# Patient Record
Sex: Male | Born: 1985 | Race: White | Hispanic: No | Marital: Single | Smoking: Current every day smoker
Health system: Southern US, Community
[De-identification: ages and names within clinical notes are randomized; demographics above are authoritative.]

## PROBLEM LIST (undated history)

## (undated) DIAGNOSIS — B1920 Unspecified viral hepatitis C without hepatic coma: Secondary | ICD-10-CM

---

## 2006-11-17 ENCOUNTER — Emergency Department: Payer: Self-pay | Admitting: Emergency Medicine

## 2006-11-20 ENCOUNTER — Emergency Department: Payer: Self-pay | Admitting: Emergency Medicine

## 2007-11-20 ENCOUNTER — Emergency Department: Payer: Self-pay | Admitting: Emergency Medicine

## 2008-06-24 ENCOUNTER — Inpatient Hospital Stay: Payer: Self-pay | Admitting: Psychiatry

## 2009-01-14 ENCOUNTER — Emergency Department: Payer: Self-pay | Admitting: Emergency Medicine

## 2011-04-20 ENCOUNTER — Emergency Department: Payer: Self-pay | Admitting: Emergency Medicine

## 2011-11-20 ENCOUNTER — Emergency Department: Payer: Self-pay | Admitting: Emergency Medicine

## 2011-11-23 ENCOUNTER — Emergency Department: Payer: Self-pay | Admitting: Emergency Medicine

## 2011-11-25 ENCOUNTER — Emergency Department: Payer: Self-pay | Admitting: Emergency Medicine

## 2013-08-06 ENCOUNTER — Emergency Department: Payer: Self-pay | Admitting: Emergency Medicine

## 2013-10-09 ENCOUNTER — Emergency Department: Payer: Self-pay | Admitting: Emergency Medicine

## 2015-06-29 ENCOUNTER — Encounter: Payer: Self-pay | Admitting: Emergency Medicine

## 2015-06-29 ENCOUNTER — Telehealth: Payer: Self-pay | Admitting: Emergency Medicine

## 2015-06-29 ENCOUNTER — Emergency Department
Admission: EM | Admit: 2015-06-29 | Discharge: 2015-06-29 | Disposition: A | Discharge status: Home / Self Care | Payer: Self-pay | Attending: Emergency Medicine | Admitting: Emergency Medicine

## 2015-06-29 DIAGNOSIS — F17210 Nicotine dependence, cigarettes, uncomplicated: Secondary | ICD-10-CM | POA: Insufficient documentation

## 2015-06-29 DIAGNOSIS — F419 Anxiety disorder, unspecified: Secondary | ICD-10-CM | POA: Insufficient documentation

## 2015-06-29 DIAGNOSIS — F1990 Other psychoactive substance use, unspecified, uncomplicated: Secondary | ICD-10-CM

## 2015-06-29 DIAGNOSIS — F1910 Other psychoactive substance abuse, uncomplicated: Secondary | ICD-10-CM | POA: Insufficient documentation

## 2015-06-29 HISTORY — DX: Unspecified viral hepatitis C without hepatic coma: B19.20

## 2015-06-29 LAB — CBC
HEMATOCRIT: 43.6 % (ref 40.0–52.0)
Hemoglobin: 14.3 g/dL (ref 13.0–18.0)
MCH: 28.9 pg (ref 26.0–34.0)
MCHC: 32.8 g/dL (ref 32.0–36.0)
MCV: 88.3 fL (ref 80.0–100.0)
Platelets: 179 10*3/uL (ref 150–440)
RBC: 4.94 MIL/uL (ref 4.40–5.90)
RDW: 13.2 % (ref 11.5–14.5)
WBC: 8.6 10*3/uL (ref 3.8–10.6)

## 2015-06-29 LAB — ACETAMINOPHEN LEVEL: Acetaminophen (Tylenol), Serum: <10 ug/mL — ABNORMAL LOW (ref 10–30)

## 2015-06-29 LAB — COMPREHENSIVE METABOLIC PANEL
ALK PHOS: 83 U/L (ref 38–126)
ALT: 119 U/L — ABNORMAL HIGH (ref 17–63)
AST: 63 U/L — AB (ref 15–41)
Albumin: 4.4 g/dL (ref 3.5–5.0)
Anion gap: 6 (ref 5–15)
BUN: 7 mg/dL (ref 6–20)
CHLORIDE: 103 mmol/L (ref 101–111)
CO2: 30 mmol/L (ref 22–32)
Calcium: 9.3 mg/dL (ref 8.9–10.3)
Creatinine, Ser: 0.8 mg/dL (ref 0.61–1.24)
Glucose, Bld: 101 mg/dL — ABNORMAL HIGH (ref 65–99)
Potassium: 4.2 mmol/L (ref 3.5–5.1)
Sodium: 139 mmol/L (ref 135–145)
Total Bilirubin: 0.1 mg/dL — ABNORMAL LOW (ref 0.3–1.2)
Total Protein: 7.8 g/dL (ref 6.5–8.1)

## 2015-06-29 LAB — ETHANOL

## 2015-06-29 MED ORDER — NALOXONE HCL 0.4 MG/0.4ML IJ SOAJ: 1.0000 "application " | Freq: Once | INTRAMUSCULAR | Status: DC

## 2015-06-29 NOTE — ED Notes (Signed)
AAOx3.  Skin warm and dry.  Ambulates with easy and steady gait. NAD.  D/C home 

## 2015-06-29 NOTE — ED Notes (Signed)
Pt reports he took what he thought was LSD, but was given something else with research chemicals.  Pt states when he took the paper and placed it on his tongue after 10-15 mins his tongue was numb for about 30 minutes and it had a bitter taste. Pt states he was having chest pain and shortness of breath and headache.  He waited about 6 hours until he decided to come back when he wasn't getting any better.  Pt took it around 0330 this morning. Pt states the research chemicals contained 25-I.  Pt states now his chest pain is improving but is aching and c/o headache still. Pt ambulatory without difficulty.  Pt admits to using cocaine 2 days ago and smoked marijuana yesterday too.

## 2015-06-29 NOTE — ED Notes (Signed)
Patient moved to 1 Hallway due to critically ill patient needing room from EMS.  Reasons for moving patient explained and apologized for any inconvenience.  Patient verbalizes understanding about reasons for moving to hall bed.  No complaints.

## 2015-06-29 NOTE — ED Provider Notes (Signed)
[facility] Emergency Department Provider Note REMINDER - THIS NOTE IS NOT A FINAL MEDICAL RECORD UNTIL IT IS SIGNED. UNTIL THEN, THE CONTENT BELOW MAY REFLECT INFORMATION FROM A DOCUMENTATION TEMPLATE, NOT THE ACTUAL PATIENT VISIT. ____________________________________________  Time seen: Approximately 2:28 PM  I have reviewed the triage vital signs and the nursing notes.   HISTORY  Chief Complaint Drug Problem    HPI Adrian Benson is a 29 y.o. male presents after taking a synthetic 25 I drugaccidentally thinking and originally LSD. States he developed a bitter taste, and attempted and spit up a portion of the pill at 3 AM. He then developed symptoms of tremulousness, feeling very anxious, states he thinks he had a "panic attack" during the early morning. Denies hallucinations. He is very close was not a suicide attempt. 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  He had a moderate headache, this has resolved. He currently reports feeling much better but wanted to make sure that he was safe after having used this drug. He clearly expresses that he is not going to do this again.  He also reports he had a fluttering in his chest and chest discomfort/tightness that occurred earlier today and is now again resolved.   Past Medical History  Diagnosis Date  . Hepatitis C     There are no active problems to display for this patient.   History reviewed. No pertinent past surgical history.  Current Outpatient Rx  Name  Route  Sig  Dispense  Refill  . Naloxone HCl 0.4 MG/0.4ML SOAJ   Injection   Inject 1 application as directed once. Dispense 1 naloxone autoinjector kit   1 Package   0     Allergies Review of patient's allergies indicates no known allergies.  History reviewed. No pertinent family history.  Social History Social History  Substance Use Topics  . Smoking status: Current Every Day Smoker -- 1.00 packs/day    Types:  Cigarettes  . Smokeless tobacco: None  . Alcohol Use: No    Review of Systems Constitutional: No fever/chills Eyes: No visual changes. ENT: No sore throat. Cardiovascular: See history of present illness Respiratory: Denies shortness of breath. Gastrointestinal: No abdominal pain.  No nausea, no vomiting.  No diarrhea.  No constipation. Genitourinary: Negative for dysuria. Musculoskeletal: Negative for back pain. Skin: Negative for rash. Neurological: Negative for focal weakness or numbness. Headache improved. No slurred speech. No trouble walking. He did have a tremor earlier but is resolved.  10-point ROS otherwise negative.  ____________________________________________   PHYSICAL EXAM:  VITAL SIGNS: ED Triage Vitals  Enc Vitals Group     BP 06/29/15 1157 140/90 mmHg     Pulse Rate 06/29/15 1157 76     Resp 06/29/15 1157 20     Temp 06/29/15 1157 98.3 F (36.8 C)     Temp Source 06/29/15 1157 Oral     SpO2 06/29/15 1157 96 %     Weight 06/29/15 1157 170 lb (77.111 kg)     Height 06/29/15 1157 '6\' 2"'  (1.88 m)     Head Cir --      Peak Flow --      Pain Score --      Pain Loc --      Pain Edu? --      Excl. in [place]? --    Constitutional: Alert and oriented. Well appearing and in no acute distress. Eyes: Conjunctivae are normal. PERRL and midline. EOMI. Head:  Atraumatic. Nose: No congestion/rhinnorhea. Mouth/Throat: Mucous membranes are moist.  Oropharynx non-erythematous. Neck: No stridor.   Cardiovascular: Normal rate, regular rhythm. Grossly normal heart sounds.  Good peripheral circulation. Respiratory: Normal respiratory effort.  No retractions. Lungs CTAB. Gastrointestinal: Soft and nontender. No distention. No abdominal bruits. No CVA tenderness. Musculoskeletal: No lower extremity tenderness nor edema.  No joint effusions. Neurologic:  Normal speech and language. No gross focal neurologic deficits are appreciated. No gait instability. Normal reflexes patellar  bilateral. No clonus. Skin:  Skin is warm, dry and intact. No rash noted. Psychiatric: Mood and affect are normal. Speech and behavior are normal.  ____________________________________________   LABS (all labs ordered are listed, but only abnormal results are displayed)  Labs Reviewed  COMPREHENSIVE METABOLIC PANEL - Abnormal; Notable for the following:    Glucose, Bld 101 (*)    AST 63 (*)    ALT 119 (*)    Total Bilirubin 0.1 (*)    All other components within normal limits  ACETAMINOPHEN LEVEL - Abnormal; Notable for the following:    Acetaminophen (Tylenol), Serum <10 (*)    All other components within normal limits  ETHANOL  CBC  URINE DRUG SCREEN, QUALITATIVE ([facility] ONLY)   ____________________________________________  EKG  ED ECG REPORT I, QUALE, MARK, the attending physician, personally viewed and interpreted this ECG.  Date: 06/29/2015 EKG Time: 1155 Rate: 65 Rhythm: normal sinus rhythm QRS Axis: normal Intervals: normal ST/T Wave abnormalities: normal Conduction Disutrbances: none Narrative Interpretation: unremarkable  ____________________________________________  RADIOLOGY   ____________________________________________   PROCEDURES  Procedure(s) performed: None  Critical Care performed: No  ____________________________________________   INITIAL IMPRESSION / ASSESSMENT AND PLAN / ED COURSE  Pertinent labs & imaging results that were available during my care of the patient were reviewed by me and considered in my medical decision making (see chart for details).  Patient presents after symptomatology after taking a synthetic drug. It does appear that he likely had side effects, but these appear to have resolved. His EKG is normal without evidence of abnormal intervals, normal QRS and QTc. He is hemodynamically stable. He is in no distress. His concerning symptoms including chest pain and headache have improved now/resolved. No neurologic  deficits.  He does have a mild transaminitis but reports a history of hepatitis C and that he has been told previously that his liver tests are slightly abnormal. He reports he's been told he needs to follow up with a doctor previously, but has not.  Patient reports symptoms and use of a synthetic call 25 5. Called and discussed with poison control, Bernice, who advises that at this time based on symptoms improvement that the patient would be safe for discharge.  Patient clearly knowledge is that this was a poor decision, and he is offered substance abuse treatment, but is not currently interested. Clearly this was not a suicide attempt, the patient presented voluntarily and reports that he actually ingested the wrong substance and experience side effects. I will discharge him to home, I will and have given him a prescription for naloxone and discussed with him the possibility of death and severe side effects from using illegal drugs/abusing drugs and he acknowledges these.  ----------------------------------------- 2:34 PM on 06/29/2015 -----------------------------------------  Awake alert no distress. Plan to discharge home if acetaminophen level is negative. ____________________________________________   FINAL CLINICAL IMPRESSION(S) / ED DIAGNOSES  Final diagnoses:  Recreational drug use      Delman Kitten, MD 06/29/15 1541

## 2015-06-29 NOTE — ED Notes (Signed)
Pt presents to ER after taking a "research chemical " even though he meant to take LSD at  0330 and noticed symptoms around 4-5 am this morning of SOB , chest pain, and headache.Pt is alert and oriented at present.

## 2015-08-01 ENCOUNTER — Emergency Department
Admission: EM | Admit: 2015-08-01 | Discharge: 2015-08-01 | Disposition: A | Discharge status: Home / Self Care | Payer: Self-pay | Attending: Emergency Medicine | Admitting: Emergency Medicine

## 2015-08-01 ENCOUNTER — Emergency Department: Payer: Self-pay

## 2015-08-01 ENCOUNTER — Encounter: Payer: Self-pay | Admitting: Emergency Medicine

## 2015-08-01 DIAGNOSIS — M25462 Effusion, left knee: Secondary | ICD-10-CM | POA: Insufficient documentation

## 2015-08-01 DIAGNOSIS — Z79891 Long term (current) use of opiate analgesic: Secondary | ICD-10-CM | POA: Insufficient documentation

## 2015-08-01 DIAGNOSIS — F17210 Nicotine dependence, cigarettes, uncomplicated: Secondary | ICD-10-CM | POA: Insufficient documentation

## 2015-08-01 DIAGNOSIS — S8992XA Unspecified injury of left lower leg, initial encounter: Secondary | ICD-10-CM | POA: Insufficient documentation

## 2015-08-01 DIAGNOSIS — Y9301 Activity, walking, marching and hiking: Secondary | ICD-10-CM | POA: Insufficient documentation

## 2015-08-01 DIAGNOSIS — Y9289 Other specified places as the place of occurrence of the external cause: Secondary | ICD-10-CM | POA: Insufficient documentation

## 2015-08-01 DIAGNOSIS — M25562 Pain in left knee: Secondary | ICD-10-CM

## 2015-08-01 DIAGNOSIS — W010XXA Fall on same level from slipping, tripping and stumbling without subsequent striking against object, initial encounter: Secondary | ICD-10-CM | POA: Insufficient documentation

## 2015-08-01 DIAGNOSIS — Y998 Other external cause status: Secondary | ICD-10-CM | POA: Insufficient documentation

## 2015-08-01 MED ORDER — IBUPROFEN 600 MG PO TABS
ORAL_TABLET | ORAL | Status: AC
  Filled 2015-08-01: qty 1

## 2015-08-01 MED ORDER — IBUPROFEN 800 MG PO TABS
800.0000 mg | ORAL_TABLET | Freq: Once | ORAL | Status: AC
Start: 2015-08-01 — End: 2015-08-01
  Administered 2015-08-01: 800 mg via ORAL
  Filled 2015-08-01: qty 1

## 2015-08-01 MED ORDER — NAPROXEN 500 MG PO TABS: 500.0000 mg | ORAL_TABLET | Freq: Two times a day (BID) | ORAL | Status: DC

## 2015-08-01 NOTE — ED Notes (Signed)
Reviewed d/c instructions, use of ice/elevation, prescriptions, and follow-up care with pt . Pt verbalized understanding.

## 2015-08-01 NOTE — Discharge Instructions (Signed)
Cryotherapy °Cryotherapy means treatment with cold. Ice or gel packs can be used to reduce both pain and swelling. Ice is the most helpful within the first 24 to 48 hours after an injury or flare-up from overusing a muscle or joint. Sprains, strains, spasms, burning pain, shooting pain, and aches can all be eased with ice. Ice can also be used when recovering from surgery. Ice is effective, has very few side effects, and is safe for most people to use. °PRECAUTIONS  °Ice is not a safe treatment option for people with: °· Raynaud phenomenon. This is a condition affecting small blood vessels in the extremities. Exposure to cold may cause your problems to return. °· Cold hypersensitivity. There are many forms of cold hypersensitivity, including: °· Cold urticaria. Red, itchy hives appear on the skin when the tissues begin to warm after being iced. °· Cold erythema. This is a red, itchy rash caused by exposure to cold. °· Cold hemoglobinuria. Red blood cells break down when the tissues begin to warm after being iced. The hemoglobin that carry oxygen are passed into the urine because they cannot combine with blood proteins fast enough. °· Numbness or altered sensitivity in the area being iced. °If you have any of the following conditions, do not use ice until you have discussed cryotherapy with your caregiver: °· Heart conditions, such as arrhythmia, angina, or chronic heart disease. °· High blood pressure. °· Healing wounds or open skin in the area being iced. °· Current infections. °· Rheumatoid arthritis. °· Poor circulation. °· Diabetes. °Ice slows the blood flow in the region it is applied. This is beneficial when trying to stop inflamed tissues from spreading irritating chemicals to surrounding tissues. However, if you expose your skin to cold temperatures for too long or without the proper protection, you can damage your skin or nerves. Watch for signs of skin damage due to cold. °HOME CARE INSTRUCTIONS °Follow  these tips to use ice and cold packs safely. °· Place a dry or damp towel between the ice and skin. A damp towel will cool the skin more quickly, so you may need to shorten the time that the ice is used. °· For a more rapid response, add gentle compression to the ice. °· Ice for no more than 10 to 20 minutes at a time. The bonier the area you are icing, the less time it will take to get the benefits of ice. °· Check your skin after 5 minutes to make sure there are no signs of a poor response to cold or skin damage. °· Rest 20 minutes or more between uses. °· Once your skin is numb, you can end your treatment. You can test numbness by very lightly touching your skin. The touch should be so light that you do not see the skin dimple from the pressure of your fingertip. When using ice, most people will feel these normal sensations in this order: cold, burning, aching, and numbness. °· Do not use ice on someone who cannot communicate their responses to pain, such as small children or people with dementia. °HOW TO MAKE AN ICE PACK °Ice packs are the most common way to use ice therapy. Other methods include ice massage, ice baths, and cryosprays. Muscle creams that cause a cold, tingly feeling do not offer the same benefits that ice offers and should not be used as a substitute unless recommended by your caregiver. °To make an ice pack, do one of the following: °· Place crushed ice or a   bag of frozen vegetables in a sealable plastic bag. Squeeze out the excess air. Place this bag inside another plastic bag. Slide the bag into a pillowcase or place a damp towel between your skin and the bag.  Mix 3 parts water with 1 part rubbing alcohol. Freeze the mixture in a sealable plastic bag. When you remove the mixture from the freezer, it will be slushy. Squeeze out the excess air. Place this bag inside another plastic bag. Slide the bag into a pillowcase or place a damp towel between your skin and the bag. SEEK MEDICAL CARE  IF:  You develop Tullis spots on your skin. This may give the skin a blotchy (mottled) appearance.  Your skin turns blue or pale.  Your skin becomes waxy or hard.  Your swelling gets worse. MAKE SURE YOU:   Understand these instructions.  Will watch your condition.  Will get help right away if you are not doing well or get worse.   This information is not intended to replace advice given to you by your health care provider. Make sure you discuss any questions you have with your health care provider.   Document Released: 03/08/2011 Document Revised: 08/02/2014 Document Reviewed: 03/08/2011 Elsevier Interactive Patient Education 2016 [employer].  How to Use a Knee Brace A knee brace is a device that you wear to support your knee, especially if the knee is healing after an injury or surgery. There are several types of knee braces. Some are designed to prevent an injury (prophylactic brace). These are often worn during sports. Others support an injured knee (functional brace) or keep it still while it heals (rehabilitative brace). People with severe arthritis of the knee may benefit from a brace that takes some pressure off the knee (unloader brace). Most knee braces are made from a combination of cloth and metal or plastic.  You may need to wear a knee brace to:  Relieve knee pain.  Help your knee support your weight (improve stability).  Help you walk farther (improve mobility).  Prevent injury.  Support your knee while it heals from surgery or from an injury. RISKS AND COMPLICATIONS Generally, knee braces are very safe to wear. However, problems may occur, including:  Skin irritation that may lead to infection.  Making your condition worse if you wear the brace in the wrong way. HOW TO USE A KNEE BRACE Different braces will have different instructions for use. Your health care provider will tell you or show you:  How to put on your brace.  How to adjust the  brace.  When and how often to wear the brace.  How to remove the brace.  If you will need any assistive devices in addition to the brace, such as crutches or a cane. In general, your brace should:  Have the hinge of the brace line up with the bend of your knee.  Have straps, hooks, or tapes that fasten snugly around your leg.  Not feel too tight or too loose. HOW TO CARE FOR A KNEE BRACE  Check your brace often for signs of damage, such as loose connections or attachments. Your knee brace may get damaged or wear out during normal use.  Wash the fabric parts of your brace with soap and water.  Read the insert that comes with your brace for other specific care instructions. SEEK MEDICAL CARE IF:  Your knee brace is too loose or too tight and you cannot adjust it.  Your knee brace causes skin  redness, swelling, bruising, or irritation.  Your knee brace is not helping.  Your knee brace is making your knee pain worse.   This information is not intended to replace advice given to you by your health care provider. Make sure you discuss any questions you have with your health care provider.   Document Released: 10/02/2003 Document Revised: 04/02/2015 Document Reviewed: 11/04/2014 Elsevier Interactive Patient Education 2016 Elsevier Inc.  Joint Pain Joint pain, which is also called arthralgia, can be caused by many things. Joint pain often goes away when you follow your health care provider's instructions for relieving pain at home. However, joint pain can also be caused by conditions that require further treatment. Common causes of joint pain include:  Bruising in the area of the joint.  Overuse of the joint.  Wear and tear on the joints that occur with aging (osteoarthritis).  Various other forms of arthritis.  A buildup of a crystal form of uric acid in the joint (gout).  Infections of the joint (septic arthritis) or of the bone (osteomyelitis). Your health care  provider may recommend medicine to help with the pain. If your joint pain continues, additional tests may be needed to diagnose your condition. HOME CARE INSTRUCTIONS Watch your condition for any changes. Follow these instructions as directed to lessen the pain that you are feeling.  Take medicines only as directed by your health care provider.  Rest the affected area for as long as your health care provider says that you should. If directed to do so, raise the painful joint above the level of your heart while you are sitting or lying down.  Do not do things that cause or worsen pain.  If directed, apply ice to the painful area:  Put ice in a plastic bag.  Place a towel between your skin and the bag.  Leave the ice on for 20 minutes, 2-3 times per day.  Wear an elastic bandage, splint, or sling as directed by your health care provider. Loosen the elastic bandage or splint if your fingers or toes become numb and tingle, or if they turn cold and blue.  Begin exercising or stretching the affected area as directed by your health care provider. Ask your health care provider what types of exercise are safe for you.  Keep all follow-up visits as directed by your health care provider. This is important. SEEK MEDICAL CARE IF:  Your pain increases, and medicine does not help.  Your joint pain does not improve within 3 days.  You have increased bruising or swelling.  You have a fever.  You lose 10 lb (4.5 kg) or more without trying. SEEK IMMEDIATE MEDICAL CARE IF:  You are not able to move the joint.  Your fingers or toes become numb or they turn cold and blue.   This information is not intended to replace advice given to you by your health care provider. Make sure you discuss any questions you have with your health care provider.   Document Released: 07/12/2005 Document Revised: 08/02/2014 Document Reviewed: 04/23/2014 Elsevier Interactive Patient Education [employer] Elsevier Inc.

## 2015-08-01 NOTE — ED Provider Notes (Signed)
CSN: [id number]     Arrival date & time 08/01/15  2112 History   None    Chief Complaint  Patient presents with  . Knee Pain    pt reports left knee pain from fall at 0630 this morning went to bed and when got up unable to bend knee     (Consider location/radiation/quality/duration/timing/severity/associated sxs/prior Treatment) HPI  30 year old male presents to the emergency department for evaluation of left knee pain. Left knee pain began at 6:30 AM. She states she was walking in the dark, tripped and fell onto his left knee. He had swelling was unable to bear weight. Unable to get to the ER until now. Has not had any medications for pain. Pain is moderate. He points to the anterior patella and medial joint line. He denies any instability. He denies any hip pain or any other injuries to his body.  Past Medical History  Diagnosis Date  . Hepatitis C    No past surgical history on file. No family history on file. Social History  Substance Use Topics  . Smoking status: Current Every Day Smoker -- 1.00 packs/day    Types: Cigarettes  . Smokeless tobacco: Not on file  . Alcohol Use: No    Review of Systems  Constitutional: Negative.  Negative for fever, chills, activity change and appetite change.  HENT: Negative for congestion, ear pain, mouth sores, rhinorrhea, sinus pressure, sore throat and trouble swallowing.   Eyes: Negative for photophobia, pain and discharge.  Respiratory: Negative for cough, chest tightness and shortness of breath.   Cardiovascular: Negative for chest pain and leg swelling.  Gastrointestinal: Negative for nausea, vomiting, abdominal pain, diarrhea and abdominal distention.  Genitourinary: Negative for dysuria and difficulty urinating.  Musculoskeletal: Positive for joint swelling and arthralgias. Negative for back pain and gait problem.  Skin: Negative for color change and rash.  Neurological: Negative for dizziness and headaches.  Hematological: Negative  for adenopathy.  Psychiatric/Behavioral: Negative for behavioral problems and agitation.      Allergies  Review of patient's allergies indicates no known allergies.  Home Medications   Prior to Admission medications   Medication Sig Start Date End Date Taking? Authorizing Provider  Naloxone HCl 0.4 MG/0.4ML SOAJ Inject 1 application as directed once. Dispense 1 naloxone autoinjector kit 06/29/15   Delman Kitten, MD  naproxen (NAPROSYN) 500 MG tablet Take 1 tablet (500 mg total) by mouth 2 (two) times daily with a meal. 08/01/15   Duanne Guess, PA-C   BP 119/60 mmHg  Pulse 108  Temp(Src) 98.7 F (37.1 C) (Oral)  Resp 18  Ht '6\' 2"'  (1.88 m)  Wt 79.379 kg  BMI 22.46 kg/m2  SpO2 97% Physical Exam  Constitutional: He is oriented to person, place, and time. He appears well-developed and well-nourished.  HENT:  Head: Normocephalic and atraumatic.  Eyes: Conjunctivae and EOM are normal. Pupils are equal, round, and reactive to light.  Neck: Normal range of motion. Neck supple.  Cardiovascular: Normal rate and intact distal pulses.   Pulmonary/Chest: Effort normal. No respiratory distress.  Musculoskeletal:  Left Lower Extremities: Examination of the left lower extremity reveals no bony abnormality, no edema, mild effusion and no ecchymosis.  There is no valgus or varus abnormality.  The patient is non-tender along the lateral joint line, and is tender along the medial joint line.  The patient has 0-105 range of motion. There is mild discomfort with range of motion exercises.  The patient has a negative rotational Mcmurray test.  There is no retropatellar discomfort.  The patient has a negative patella stretch test.  The patient has a negative varus stress test and a negative valgus stress test, in looking for stability.  The patient has a negative Lachman's test. Patient is able to straight leg raise.  Vascular: The patient has a negative Bevelyn Buckles' test bilaterally.  The patient had a normal  dorsalis pedis and posterior tibial pulse.  There is normal skin warmth.  There is normal capillary refill bilaterally.    Neurologic: The patient has a negative straight leg raise.  The patient has normal muscle strength testing for the quadriceps, calves, ankle dorsiflexion, ankle plantarflexion, and extensor hallicus longus.  The patient has sensation that is intact to light touch.  The deep tendon reflexes are normal at the   Neurological: He is alert and oriented to person, place, and time.  Skin: Skin is warm and dry.  Psychiatric: He has a normal mood and affect. His behavior is normal. Judgment and thought content normal.    ED Course  Procedures (including critical care time) Labs Review Labs Reviewed - No data to display  Imaging Review Dg Knee Complete 4 Views Left  08/01/2015  CLINICAL DATA:  [address] this morning with left knee pain EXAM: LEFT KNEE - COMPLETE 4+ VIEW COMPARISON:  None. FINDINGS: No fracture or dislocation.  Small joint effusion. IMPRESSION: Joint effusion Electronically Signed   By: Skipper Cliche M.D.   On: 08/01/2015 21:40   I have personally reviewed and evaluated these images and lab results as part of my medical decision-making.   EKG Interpretation None      MDM   Final diagnoses:  Left knee pain  Knee effusion, left    30 year old male with left knee pain. X-ray showed no acute fracture. There is a mild effusion. Knee is stable to ligamentous stress testing. He was placed into a knee immobilizer, given crutches. He will start naproxen 500 mg twice a day. Rest ice and elevate knee. Follow-up with orthopedics in 5-7 days if no improvement.    Duanne Guess, PA-C 08/01/15 2157  Delman Kitten, MD 08/01/15 [phone]

## 2015-08-01 NOTE — ED Notes (Signed)
pt reports left knee pain from fall at 0630 this morning went to bed and when got up unable to bend knee

## 2016-04-03 ENCOUNTER — Emergency Department
Admission: EM | Admit: 2016-04-03 | Discharge: 2016-04-03 | Disposition: A | Discharge status: Home / Self Care | Payer: Self-pay | Attending: Emergency Medicine | Admitting: Emergency Medicine

## 2016-04-03 DIAGNOSIS — L0291 Cutaneous abscess, unspecified: Secondary | ICD-10-CM

## 2016-04-03 DIAGNOSIS — F17210 Nicotine dependence, cigarettes, uncomplicated: Secondary | ICD-10-CM | POA: Insufficient documentation

## 2016-04-03 DIAGNOSIS — L02413 Cutaneous abscess of right upper limb: Secondary | ICD-10-CM | POA: Insufficient documentation

## 2016-04-03 DIAGNOSIS — F1290 Cannabis use, unspecified, uncomplicated: Secondary | ICD-10-CM | POA: Insufficient documentation

## 2016-04-03 MED ORDER — IBUPROFEN 800 MG PO TABS: 800.0000 mg | ORAL_TABLET | Freq: Three times a day (TID) | ORAL | 0 refills | Status: DC | PRN

## 2016-04-03 MED ORDER — OXYCODONE-ACETAMINOPHEN 5-325 MG PO TABS: 1.0000 | ORAL_TABLET | ORAL | 0 refills | Status: DC | PRN

## 2016-04-03 MED ORDER — OXYCODONE-ACETAMINOPHEN 5-325 MG PO TABS
1.0000 | ORAL_TABLET | Freq: Once | ORAL | Status: AC
  Administered 2016-04-03: 1 via ORAL
  Filled 2016-04-03: qty 1

## 2016-04-03 MED ORDER — SULFAMETHOXAZOLE-TRIMETHOPRIM 800-160 MG PO TABS: 2.0000 | ORAL_TABLET | Freq: Two times a day (BID) | ORAL | 0 refills | Status: DC

## 2016-04-03 MED ORDER — IBUPROFEN 800 MG PO TABS
800.0000 mg | ORAL_TABLET | Freq: Once | ORAL | Status: AC
  Administered 2016-04-03: 800 mg via ORAL
  Filled 2016-04-03: qty 1

## 2016-04-03 MED ORDER — LIDOCAINE HCL 1 % IJ SOLN
5.0000 mL | Freq: Once | INTRAMUSCULAR | Status: AC
  Administered 2016-04-03: 5 mL

## 2016-04-03 MED ORDER — SULFAMETHOXAZOLE-TRIMETHOPRIM 800-160 MG PO TABS
2.0000 | ORAL_TABLET | Freq: Once | ORAL | Status: AC
  Administered 2016-04-03: 2 via ORAL
  Filled 2016-04-03: qty 2

## 2016-04-03 MED ORDER — LIDOCAINE HCL (PF) 1 % IJ SOLN
INTRAMUSCULAR | Status: AC
  Administered 2016-04-03: 5 mL
  Filled 2016-04-03: qty 5

## 2016-04-03 NOTE — ED Provider Notes (Signed)
[facility] Emergency Department Provider Note   ____________________________________________   First MD Initiated Contact with Patient 04/03/16 [phone]     (approximate)  I have reviewed the triage vital signs and the nursing notes.   HISTORY  Chief Complaint Abscess    HPI Adrian Benson is a 30 y.o. male who presents to the ED from home with a chief complaint of right shoulder abscess. Noted redness and swelling which began to the top of his right shoulder yesterday. He attempted to pop the abscess twice; got small amount of drainage. Denies associated fever, chills, chest pain, shortness of breath, abdominal pain, nausea, vomiting, diarrhea. Denies recent travel or trauma. Nothing makes his symptoms better or worse.   Past Medical History:  Diagnosis Date  . Hepatitis C     There are no active problems to display for this patient.   History reviewed. No pertinent surgical history.  Prior to Admission medications   Medication Sig Start Date End Date Taking? Authorizing Provider  ibuprofen (ADVIL,MOTRIN) 800 MG tablet Take 1 tablet (800 mg total) by mouth every 8 (eight) hours as needed for moderate pain. 04/03/16   Paulette Blanch, MD  Naloxone HCl 0.4 MG/0.4ML SOAJ Inject 1 application as directed once. Dispense 1 naloxone autoinjector kit 06/29/15   Delman Kitten, MD  naproxen (NAPROSYN) 500 MG tablet Take 1 tablet (500 mg total) by mouth 2 (two) times daily with a meal. 08/01/15   Duanne Guess, PA-C  oxyCODONE-acetaminophen (ROXICET) 5-325 MG tablet Take 1 tablet by mouth every 4 (four) hours as needed for severe pain. 04/03/16   Paulette Blanch, MD  sulfamethoxazole-trimethoprim (BACTRIM DS,SEPTRA DS) 800-160 MG tablet Take 2 tablets by mouth 2 (two) times daily. 04/03/16   Paulette Blanch, MD    Allergies Review of patient's allergies indicates no known allergies.  No family history on file.  Social History Social History  Substance Use Topics  . Smoking  status: Current Every Day Smoker    Packs/day: 1.00    Types: Cigarettes  . Smokeless tobacco: Never Used  . Alcohol use No    Review of Systems  Constitutional: No fever/chills. Eyes: No visual changes. ENT: No sore throat. Cardiovascular: Denies chest pain. Respiratory: Denies shortness of breath. Gastrointestinal: No abdominal pain.  No nausea, no vomiting.  No diarrhea.  No constipation. Genitourinary: Negative for dysuria. Musculoskeletal: Negative for back pain. Skin: Positive for abscess. Negative for rash. Neurological: Negative for headaches, focal weakness or numbness.  10-point ROS otherwise negative.  ____________________________________________   PHYSICAL EXAM:  VITAL SIGNS: ED Triage Vitals  Enc Vitals Group     BP 04/03/16 0129 121/78     Pulse Rate 04/03/16 0129 76     Resp 04/03/16 0129 16     Temp 04/03/16 0129 98 F (36.7 C)     Temp Source 04/03/16 0129 Oral     SpO2 04/03/16 0129 100 %     Weight 04/03/16 0129 160 lb (72.6 kg)     Height 04/03/16 0129 _0  (1.88 m)     Head Circumference --      Peak Flow --      Pain Score 04/03/16 0128 8     Pain Loc --      Pain Edu? --      Excl. in [place]? --     Constitutional: Alert and oriented. Well appearing and in no acute distress. Eyes: Conjunctivae are normal. PERRL. EOMI. Head: Atraumatic. Nose:  No congestion/rhinnorhea. Mouth/Throat: Mucous membranes are moist.  Oropharynx non-erythematous. Neck: No stridor.   Cardiovascular: Normal rate, regular rhythm. Grossly normal heart sounds.  Good peripheral circulation. Respiratory: Normal respiratory effort.  No retractions. Lungs CTAB. Gastrointestinal: Soft and nontender. No distention. No abdominal bruits. No CVA tenderness. Musculoskeletal: Right shoulder with abscess. Full range of shoulder without difficulty. No suspicion for septic joint. Neurologic:  Normal speech and language. No gross focal neurologic deficits are appreciated. No gait  instability. Skin:  Approximately 3 x 3 cm area of warmth and erythema with central black scab to top of right shoulder. Fluctuance and induration noted. Skin is warm, dry and intact. No rash noted. Psychiatric: Mood and affect are normal. Speech and behavior are normal.  ____________________________________________   LABS (all labs ordered are listed, but only abnormal results are displayed)  Labs Reviewed - No data to display ____________________________________________  EKG  None ____________________________________________  RADIOLOGY  None ____________________________________________   PROCEDURES  Procedure(s) performed:   INCISION AND DRAINAGE Performed by: Paulette Blanch Consent: Verbal consent obtained. Risks and benefits: risks, benefits and alternatives were discussed Type: abscess  Body area: Right shoulder  Anesthesia: local infiltration  Incision was made with a scalpel.  Local anesthetic: lidocaine 1% w/o epinephrine  Anesthetic total: 8 ml  Complexity: complex Blunt dissection to break up loculations  Drainage: purulent  Drainage amount: small  Packing material: 1/4 in iodoform gauze  Patient tolerance: Patient tolerated the procedure well with no immediate complications.    Procedures  Critical Care performed: No  ____________________________________________   INITIAL IMPRESSION / ASSESSMENT AND PLAN / ED COURSE  Pertinent labs & imaging results that were available during my care of the patient were reviewed by me and considered in my medical decision making (see chart for details).  30 year old male who presents with right shoulder abscess status post incision and drainage. Will discharge home on Bactrim, analgesia and patient instructed to have the wound checked in 2 days. Strict return precautions given. Patient verbalizes understanding and agrees with plan of care.  Clinical Course      ____________________________________________   FINAL CLINICAL IMPRESSION(S) / ED DIAGNOSES  Final diagnoses:  Abscess      NEW MEDICATIONS STARTED DURING THIS VISIT:  New Prescriptions   IBUPROFEN (ADVIL,MOTRIN) 800 MG TABLET    Take 1 tablet (800 mg total) by mouth every 8 (eight) hours as needed for moderate pain.   OXYCODONE-ACETAMINOPHEN (ROXICET) 5-325 MG TABLET    Take 1 tablet by mouth every 4 (four) hours as needed for severe pain.   SULFAMETHOXAZOLE-TRIMETHOPRIM (BACTRIM DS,SEPTRA DS) 800-160 MG TABLET    Take 2 tablets by mouth 2 (two) times daily.     Note:  This document was prepared using Dragon voice recognition software and may include unintentional dictation errors.    Paulette Blanch, MD 04/03/16 [phone]

## 2016-04-03 NOTE — ED Triage Notes (Signed)
Pt reports to ED w/ c/o R shoulder abscess.  Pt sts abscess began to develop yesterday, that he attempted to pop abscess twice, unsuccessful. Pt A/Ox4, ambulatory to room w/o issue, resp even and unlabored. NAD.

## 2016-04-03 NOTE — Discharge Instructions (Signed)
1. Take antibiotic as prescribed (Bactrim DS 2 tablets twice daily 10 days). 2. You may take pain medicines as needed (Motrin/Percocet #15). 3. Return to the ER for worsening symptoms, increased redness/swelling, fever, persistent vomiting or other concerns.

## 2016-04-03 NOTE — ED Notes (Signed)
Discharge instructions reviewed with patient. Questions fielded by this RN. Patient verbalizes understanding of instructions. Patient discharged home in stable condition per Sung MD . No acute distress noted at time of discharge.   

## 2016-04-07 ENCOUNTER — Emergency Department
Admission: EM | Admit: 2016-04-07 | Discharge: 2016-04-07 | Disposition: A | Discharge status: Home / Self Care | Payer: Self-pay | Attending: Emergency Medicine | Admitting: Emergency Medicine

## 2016-04-07 ENCOUNTER — Encounter: Payer: Self-pay | Admitting: Emergency Medicine

## 2016-04-07 DIAGNOSIS — L0291 Cutaneous abscess, unspecified: Secondary | ICD-10-CM

## 2016-04-07 DIAGNOSIS — Z791 Long term (current) use of non-steroidal anti-inflammatories (NSAID): Secondary | ICD-10-CM | POA: Insufficient documentation

## 2016-04-07 DIAGNOSIS — F17210 Nicotine dependence, cigarettes, uncomplicated: Secondary | ICD-10-CM | POA: Insufficient documentation

## 2016-04-07 DIAGNOSIS — L02413 Cutaneous abscess of right upper limb: Secondary | ICD-10-CM | POA: Insufficient documentation

## 2016-04-07 MED ORDER — OXYCODONE-ACETAMINOPHEN 5-325 MG PO TABS: 1.0000 | ORAL_TABLET | ORAL | 0 refills | Status: DC | PRN

## 2016-04-07 MED ORDER — ONDANSETRON 4 MG PO TBDP
4.0000 mg | ORAL_TABLET | Freq: Once | ORAL | Status: AC
  Administered 2016-04-07: 4 mg via ORAL
  Filled 2016-04-07: qty 1

## 2016-04-07 MED ORDER — LIDOCAINE HCL (PF) 1 % IJ SOLN
5.0000 mL | Freq: Once | INTRAMUSCULAR | Status: AC
  Administered 2016-04-07: 5 mL

## 2016-04-07 MED ORDER — LIDOCAINE HCL (PF) 1 % IJ SOLN
INTRAMUSCULAR | Status: AC
  Administered 2016-04-07: 5 mL
  Filled 2016-04-07: qty 5

## 2016-04-07 NOTE — ED Provider Notes (Signed)
[facility] Emergency Department Provider Note  ____________________________________________  Time seen: Approximately 12:59 PM  I have reviewed the triage vital signs and the nursing notes.   HISTORY  Chief Complaint Abscess    HPI KHADEN GATER is a 30 y.o. male presents for evaluation of abscess to his right shoulder. Patient was here 4 days ago instructed to return in 2 days but decided not to. Complains of continued pain and swelling despite antibiotics.   Past Medical History:  Diagnosis Date  . Hepatitis C     There are no active problems to display for this patient.   History reviewed. No pertinent surgical history.  Prior to Admission medications   Medication Sig Start Date End Date Taking? Authorizing Provider  ibuprofen (ADVIL,MOTRIN) 800 MG tablet Take 1 tablet (800 mg total) by mouth every 8 (eight) hours as needed for moderate pain. 04/03/16   Irean Hong, MD  oxyCODONE-acetaminophen (ROXICET) 5-325 MG tablet Take 1-2 tablets by mouth every 4 (four) hours as needed for severe pain. 04/07/16   Charmayne Sheer Lavenia Stumpo, PA-C  sulfamethoxazole-trimethoprim (BACTRIM DS,SEPTRA DS) 800-160 MG tablet Take 2 tablets by mouth 2 (two) times daily. 04/03/16   Irean Hong, MD    Allergies Review of patient's allergies indicates no known allergies.  No family history on file.  Social History Social History  Substance Use Topics  . Smoking status: Current Every Day Smoker    Packs/day: 1.00    Types: Cigarettes  . Smokeless tobacco: Never Used  . Alcohol use No    Review of Systems Constitutional: No fever/chills Musculoskeletal: Negative for back pain. Skin: Positive for 3 cm abscess to the right lateral aspect of the shoulder. Neurological: Negative for headaches, focal weakness or numbness.  10-point ROS otherwise negative.  ____________________________________________   PHYSICAL EXAM:  VITAL SIGNS: ED Triage Vitals  Enc Vitals Group      BP 04/07/16 1227 (!) 104/58     Pulse Rate 04/07/16 1227 (!) 49     Resp 04/07/16 1227 17     Temp 04/07/16 1227 98.4 F (36.9 C)     Temp Source 04/07/16 1227 Oral     SpO2 04/07/16 1227 99 %     Weight 04/07/16 1226 160 lb (72.6 kg)     Height 04/07/16 1226 6\' 2"  (1.88 m)     Head Circumference --      Peak Flow --      Pain Score 04/07/16 1226 6     Pain Loc --      Pain Edu? --      Excl. in [place]? --     Constitutional: Alert and oriented. Well appearing and in no acute distress.   Musculoskeletal: No lower extremity tenderness nor edema.  No joint effusions. Neurologic:  Normal speech and language. No gross focal neurologic deficits are appreciated. No gait instability. Skin:  Skin is warm, dry and intact. 3 cm abscess with fluctuance noted medially. Psychiatric: Mood and affect are normal. Speech and behavior are normal.  ____________________________________________   LABS (all labs ordered are listed, but only abnormal results are displayed)  Labs Reviewed - No data to display ____________________________________________  EKG   ____________________________________________  RADIOLOGY   ____________________________________________   PROCEDURES  Procedure(s) performed: Yes  I&D performed under sterile procedure. #11 blade used minimal amount of pustular drainage noted. Wound packed with iodoform gauze 1/4 inch.  Critical Care performed: No  ____________________________________________   INITIAL IMPRESSION / ASSESSMENT AND  PLAN / ED COURSE  Pertinent labs & imaging results that were available during my care of the patient were reviewed by me and considered in my medical decision making (see chart for details). Review of the [place] CSRS was performed in accordance of the [facility] prior to dispensing any controlled drugs.  Cutaneous abscess to the right shoulder. Patient started on Percocet for pain and return to the ED in 48 hours for wound check.  Clinical  Course    ____________________________________________   FINAL CLINICAL IMPRESSION(S) / ED DIAGNOSES  Final diagnoses:  Abscess     This chart was dictated using voice recognition software/Dragon. Despite best efforts to proofread, errors can occur which can change the meaning. Any change was purely unintentional.    Evangeline Dakinharles M Brannon Decaire, PA-C 04/07/16 1422    Minna AntisKevin Paduchowski, MD 04/07/16 1550

## 2016-04-07 NOTE — ED Notes (Signed)

## 2016-04-07 NOTE — ED Triage Notes (Signed)
Was seen about 4 days ago for abscess area to right shoulder  Was placed on antibiotics and pain meds  States area is larger and more painful

## 2016-12-21 ENCOUNTER — Emergency Department
Admission: EM | Admit: 2016-12-21 | Discharge: 2016-12-21 | Disposition: A | Discharge status: Home / Self Care | Payer: Self-pay | Attending: Emergency Medicine | Admitting: Emergency Medicine

## 2016-12-21 ENCOUNTER — Encounter: Payer: Self-pay | Admitting: Emergency Medicine

## 2016-12-21 ENCOUNTER — Emergency Department: Payer: Self-pay

## 2016-12-21 DIAGNOSIS — Y998 Other external cause status: Secondary | ICD-10-CM | POA: Insufficient documentation

## 2016-12-21 DIAGNOSIS — W010XXA Fall on same level from slipping, tripping and stumbling without subsequent striking against object, initial encounter: Secondary | ICD-10-CM | POA: Insufficient documentation

## 2016-12-21 DIAGNOSIS — S8001XA Contusion of right knee, initial encounter: Secondary | ICD-10-CM | POA: Insufficient documentation

## 2016-12-21 DIAGNOSIS — Y929 Unspecified place or not applicable: Secondary | ICD-10-CM | POA: Insufficient documentation

## 2016-12-21 DIAGNOSIS — F17210 Nicotine dependence, cigarettes, uncomplicated: Secondary | ICD-10-CM | POA: Insufficient documentation

## 2016-12-21 DIAGNOSIS — Y939 Activity, unspecified: Secondary | ICD-10-CM | POA: Insufficient documentation

## 2016-12-21 MED ORDER — IBUPROFEN 600 MG PO TABS: 600.0000 mg | ORAL_TABLET | Freq: Three times a day (TID) | ORAL | 0 refills | Status: DC | PRN

## 2016-12-21 MED ORDER — IBUPROFEN 600 MG PO TABS
600.0000 mg | ORAL_TABLET | Freq: Once | ORAL | Status: AC
  Administered 2016-12-21: 600 mg via ORAL
  Filled 2016-12-21: qty 1

## 2016-12-21 MED ORDER — TRAMADOL HCL 50 MG PO TABS: 50.0000 mg | ORAL_TABLET | Freq: Two times a day (BID) | ORAL | 0 refills | Status: DC | PRN

## 2016-12-21 MED ORDER — OXYCODONE-ACETAMINOPHEN 5-325 MG PO TABS
1.0000 | ORAL_TABLET | Freq: Once | ORAL | Status: AC
  Administered 2016-12-21: 1 via ORAL
  Filled 2016-12-21: qty 1

## 2016-12-21 NOTE — ED Triage Notes (Signed)
Pt c/o right knee pain after fall today. Slipped getting into shower. Did not hit head. NAD. Abrasion to right knee.

## 2016-12-21 NOTE — ED Notes (Signed)
See triage note..the patient c/o R knee pain r/t injury. Pt sts that he slipped in shower, denies head injury, LOC, n/v/d. Pt has motor function, sensation and circulation to LLE. Abrasion to R knee noted, no active bleeding. NAD

## 2016-12-21 NOTE — ED Provider Notes (Signed)
[facility] Emergency Department Provider Note   ____________________________________________   None    (approximate)  I have reviewed the triage vital signs and the nursing notes.   HISTORY  Chief Complaint Fall    HPI Adrian Benson is a 31 y.o. male patient complaining of right knee pain status post fall. Patient states it is sharp landing on his right knee. Patient denies LOC or head injuries. Incident occurred approximate one half hours ago. No palliative measures for complaint. Patient rates his pain as 8/10. Patient describes pain as "achy". Patient stated pain increases with flexion of the knee.   Past Medical History:  Diagnosis Date  . Hepatitis C     There are no active problems to display for this patient.   History reviewed. No pertinent surgical history.  Prior to Admission medications   Medication Sig Start Date End Date Taking? Authorizing Provider  ibuprofen (ADVIL,MOTRIN) 600 MG tablet Take 1 tablet (600 mg total) by mouth every 8 (eight) hours as needed. 12/21/16   Joni Reining, PA-C  ibuprofen (ADVIL,MOTRIN) 800 MG tablet Take 1 tablet (800 mg total) by mouth every 8 (eight) hours as needed for moderate pain. 04/03/16   Irean Hong, MD  oxyCODONE-acetaminophen (ROXICET) 5-325 MG tablet Take 1-2 tablets by mouth every 4 (four) hours as needed for severe pain. 04/07/16   Beers, Charmayne Sheer, PA-C  sulfamethoxazole-trimethoprim (BACTRIM DS,SEPTRA DS) 800-160 MG tablet Take 2 tablets by mouth 2 (two) times daily. 04/03/16   Irean Hong, MD  traMADol (ULTRAM) 50 MG tablet Take 1 tablet (50 mg total) by mouth every 12 (twelve) hours as needed. 12/21/16   Joni Reining, PA-C    Allergies Patient has no known allergies.  History reviewed. No pertinent family history.  Social History Social History  Substance Use Topics  . Smoking status: Current Every Day Smoker    Packs/day: 1.00    Types: Cigarettes  . Smokeless tobacco:  Never Used  . Alcohol use No    Review of Systems  Constitutional: No fever/chills Eyes: No visual changes. ENT: No sore throat. Cardiovascular: Denies chest pain. Respiratory: Denies shortness of breath. Gastrointestinal: No abdominal pain.  No nausea, no vomiting.  No diarrhea.  No constipation. Genitourinary: Negative for dysuria. Musculoskeletal: right knee pain Skin: Negative for rash. Neurological: Negative for headaches, focal weakness or numbness. Endocrine:Hepatitis C  ____________________________________________   PHYSICAL EXAM:  VITAL SIGNS: ED Triage Vitals  Enc Vitals Group     BP 12/21/16 1710 114/79     Pulse Rate 12/21/16 1710 90     Resp 12/21/16 1710 20     Temp 12/21/16 1710 98 F (36.7 C)     Temp Source 12/21/16 1710 Oral     SpO2 12/21/16 1710 97 %     Weight 12/21/16 1711 180 lb (81.6 kg)     Height 12/21/16 1711 6\' 2"  (1.88 m)     Head Circumference --      Peak Flow --      Pain Score 12/21/16 1712 8     Pain Loc --      Pain Edu? --      Excl. in [place]? --     Constitutional: Alert and oriented. Well appearing and in no acute distress. Cardiovascular: Normal rate, regular rhythm. Grossly normal heart sounds.  Good peripheral circulation. Respiratory: Normal respiratory effort.  No retractions. Lungs CTAB. Musculoskeletal: no obvious deformity to the right knee. Moderate guarding palpation  of the patella crepitus to palpation of the patella. Neurologic:  Normal speech and language. No gross focal neurologic deficits are appreciated. No gait instability. Skin:  Skin is warm, dry and intact. No rash noted.Abrasion anterior patella right leg. Psychiatric: Mood and affect are normal. Speech and behavior are normal.  ____________________________________________   LABS (all labs ordered are listed, but only abnormal results are displayed)  Labs Reviewed - No data to  display ____________________________________________  EKG   ____________________________________________  RADIOLOGY   ___no acute findings x-ray of the right knee._________________________________________   PROCEDURES  Procedure(s) performed: None  Procedures  Critical Care performed: No  ____________________________________________   INITIAL IMPRESSION / ASSESSMENT AND PLAN / ED COURSE  Pertinent labs & imaging results that were available during my care of the patient were reviewed by me and considered in my medical decision making (see chart for details).  Right knee contusion secondary to fall. Discussed neck x-ray finding with patient. Patient given discharge care instruction. Patient family with crutches as needed for 2-3 days.      ____________________________________________   FINAL CLINICAL IMPRESSION(S) / ED DIAGNOSES  Final diagnoses:  Contusion of right knee, initial encounter      NEW MEDICATIONS STARTED DURING THIS VISIT:  New Prescriptions   IBUPROFEN (ADVIL,MOTRIN) 600 MG TABLET    Take 1 tablet (600 mg total) by mouth every 8 (eight) hours as needed.   TRAMADOL (ULTRAM) 50 MG TABLET    Take 1 tablet (50 mg total) by mouth every 12 (twelve) hours as needed.     Note:  This document was prepared using Dragon voice recognition software and may include unintentional dictation errors.    Joni ReiningSmith, Ronald K, PA-C 12/21/16 1815    Sharman CheekStafford, Phillip, MD 12/21/16 2027

## 2017-04-06 ENCOUNTER — Emergency Department
Admission: EM | Admit: 2017-04-06 | Discharge: 2017-04-06 | Disposition: A | Discharge status: Home / Self Care | Payer: Self-pay | Attending: Emergency Medicine | Admitting: Emergency Medicine

## 2017-04-06 ENCOUNTER — Encounter: Payer: Self-pay | Admitting: *Deleted

## 2017-04-06 DIAGNOSIS — M7542 Impingement syndrome of left shoulder: Secondary | ICD-10-CM | POA: Insufficient documentation

## 2017-04-06 DIAGNOSIS — S161XXA Strain of muscle, fascia and tendon at neck level, initial encounter: Secondary | ICD-10-CM | POA: Insufficient documentation

## 2017-04-06 DIAGNOSIS — X501XXA Overexertion from prolonged static or awkward postures, initial encounter: Secondary | ICD-10-CM | POA: Insufficient documentation

## 2017-04-06 DIAGNOSIS — Y929 Unspecified place or not applicable: Secondary | ICD-10-CM | POA: Insufficient documentation

## 2017-04-06 DIAGNOSIS — Z79899 Other long term (current) drug therapy: Secondary | ICD-10-CM | POA: Insufficient documentation

## 2017-04-06 DIAGNOSIS — Y9389 Activity, other specified: Secondary | ICD-10-CM | POA: Insufficient documentation

## 2017-04-06 DIAGNOSIS — M5412 Radiculopathy, cervical region: Secondary | ICD-10-CM | POA: Insufficient documentation

## 2017-04-06 DIAGNOSIS — Y999 Unspecified external cause status: Secondary | ICD-10-CM | POA: Insufficient documentation

## 2017-04-06 DIAGNOSIS — F17210 Nicotine dependence, cigarettes, uncomplicated: Secondary | ICD-10-CM | POA: Insufficient documentation

## 2017-04-06 MED ORDER — METHOCARBAMOL 500 MG PO TABS
1000.0000 mg | ORAL_TABLET | Freq: Once | ORAL | Status: AC
  Administered 2017-04-06: 1000 mg via ORAL
  Filled 2017-04-06: qty 2

## 2017-04-06 MED ORDER — HYDROCODONE-ACETAMINOPHEN 5-325 MG PO TABS
1.0000 | ORAL_TABLET | Freq: Once | ORAL | Status: AC
  Administered 2017-04-06: 1 via ORAL
  Filled 2017-04-06: qty 1

## 2017-04-06 MED ORDER — METHOCARBAMOL 500 MG PO TABS: 500.0000 mg | ORAL_TABLET | Freq: Four times a day (QID) | ORAL | 0 refills | Status: DC

## 2017-04-06 MED ORDER — MELOXICAM 7.5 MG PO TABS
15.0000 mg | ORAL_TABLET | Freq: Once | ORAL | Status: AC
  Administered 2017-04-06: 15 mg via ORAL
  Filled 2017-04-06: qty 2

## 2017-04-06 MED ORDER — MELOXICAM 15 MG PO TABS: 15.0000 mg | ORAL_TABLET | Freq: Every day | ORAL | 0 refills | Status: DC

## 2017-04-06 MED ORDER — ONDANSETRON 8 MG PO TBDP
8.0000 mg | ORAL_TABLET | Freq: Once | ORAL | Status: AC
  Administered 2017-04-06: 8 mg via ORAL
  Filled 2017-04-06: qty 1

## 2017-04-06 NOTE — ED Provider Notes (Signed)
[facility] Emergency Department Provider Note  ____________________________________________  Time seen: Approximately 9:19 PM  I have reviewed the triage vital signs and the nursing notes.   HISTORY  Chief Complaint Shoulder Pain and Neck Pain    HPI Adrian Benson is a 31 y.o. male who presents to emergency room complaining of left shoulder pain. Patient reports that this morning he reached behind him as he woke up trying to grab something off the floor. Patient reports that he felt a pop and had pain to the posterior left shoulder. Patient reports that he has chronic issues with bilateral shoulders and did not think anything of it. Patient reports that through the day symptoms improved. He was at work today lifting a heavy object when he felt a another pop in his shoulder and has been having pain to both the anterior and posterior shoulder since. Patient has intermittent numbness and tingling to the left hand. Limited range of motion due to pain. Patient denies any headache, visual changes, chest pain, shortness of breath, abdominal pain, nausea vomiting.   Past Medical History:  Diagnosis Date  . Hepatitis C     There are no active problems to display for this patient.   History reviewed. No pertinent surgical history.  Prior to Admission medications   Medication Sig Start Date End Date Taking? Authorizing Provider  ibuprofen (ADVIL,MOTRIN) 600 MG tablet Take 1 tablet (600 mg total) by mouth every 8 (eight) hours as needed. 12/21/16   Joni ReiningSmith, Ronald K, PA-C  ibuprofen (ADVIL,MOTRIN) 800 MG tablet Take 1 tablet (800 mg total) by mouth every 8 (eight) hours as needed for moderate pain. 04/03/16   Irean HongSung, Jade J, MD  meloxicam (MOBIC) 15 MG tablet Take 1 tablet (15 mg total) by mouth daily. 04/06/17   Cuthriell, Delorise RoyalsJonathan D, PA-C  methocarbamol (ROBAXIN) 500 MG tablet Take 1 tablet (500 mg total) by mouth 4 (four) times daily. 04/06/17   Cuthriell, Delorise RoyalsJonathan D, PA-C   oxyCODONE-acetaminophen (ROXICET) 5-325 MG tablet Take 1-2 tablets by mouth every 4 (four) hours as needed for severe pain. 04/07/16   Beers, Charmayne Sheerharles M, PA-C  sulfamethoxazole-trimethoprim (BACTRIM DS,SEPTRA DS) 800-160 MG tablet Take 2 tablets by mouth 2 (two) times daily. 04/03/16   Irean HongSung, Jade J, MD  traMADol (ULTRAM) 50 MG tablet Take 1 tablet (50 mg total) by mouth every 12 (twelve) hours as needed. 12/21/16   Joni ReiningSmith, Ronald K, PA-C    Allergies Patient has no known allergies.  History reviewed. No pertinent family history.  Social History Social History  Substance Use Topics  . Smoking status: Current Every Day Smoker    Packs/day: 1.00    Types: Cigarettes  . Smokeless tobacco: Never Used  . Alcohol use Yes     Comment: occasional     Review of Systems  Constitutional: No fever/chills Eyes: No visual changes. No discharge ENT: No upper respiratory complaints. Cardiovascular: no chest pain. Respiratory: no cough. No SOB. Gastrointestinal: No abdominal pain.  No nausea, no vomiting. Musculoskeletal: Positive for left shoulder pain Skin: Negative for rash, abrasions, lacerations, ecchymosis. Neurological: Negative for headaches, focal weakness or numbness. 10-point ROS otherwise negative.  ____________________________________________   PHYSICAL EXAM:  VITAL SIGNS: ED Triage Vitals  Enc Vitals Group     BP 04/06/17 1837 130/87     Pulse Rate 04/06/17 1837 79     Resp 04/06/17 1837 16     Temp 04/06/17 1837 97.6 F (36.4 C)     Temp Source  04/06/17 1837 Oral     SpO2 04/06/17 1837 99 %     Weight 04/06/17 1839 175 lb (79.4 kg)     Height 04/06/17 1839  (1.88 m)     Head Circumference --      Peak Flow --      Pain Score 04/06/17 1839 5     Pain Loc --      Pain Edu? --      Excl. in GC? --      Constitutional: Alert and oriented. Well appearing and in no acute distress. Eyes: Conjunctivae are normal. PERRL. EOMI. Head: Atraumatic. Neck: No stridor.  No  midline cervical spine tenderness to palpation.   Cardiovascular: Normal rate, regular rhythm. Normal S1 and S2.  Good peripheral circulation. Respiratory: Normal respiratory effort without tachypnea or retractions. Lungs CTAB. Good air entry to the bases with no decreased or absent breath sounds. Musculoskeletal: Full range of motion to all extremities. No gross deformities appreciated. No gross deformities, edema, ecchymosis noted to left shoulder. Patient is tender to palpation over the left-sided trapezius muscle. Spasms are appreciated. No tenderness to palpation over the scapula or scapular spine. Patient is tender to palpation over the acromioclavicular joint. No palpable abnormality. With coaxing, patient has good range of motion to the left shoulder. Positive Neer's test. Good radial pulse and sensation distally. Neurologic:  Normal speech and language. No gross focal neurologic deficits are appreciated.  Skin:  Skin is warm, dry and intact. No rash noted. Psychiatric: Mood and affect are normal. Speech and behavior are normal. Patient exhibits appropriate insight and judgement.   ____________________________________________   LABS (all labs ordered are listed, but only abnormal results are displayed)  Labs Reviewed - No data to display ____________________________________________  EKG   ____________________________________________  RADIOLOGY   No results found.  ____________________________________________    PROCEDURES  Procedure(s) performed:    Procedures    Medications  HYDROcodone-acetaminophen (NORCO/VICODIN) 5-325 MG per tablet 1 tablet (1 tablet Oral Given 04/06/17 2006)  ondansetron (ZOFRAN-ODT) disintegrating tablet 8 mg (8 mg Oral Given 04/06/17 2006)  meloxicam (MOBIC) tablet 15 mg (15 mg Oral Given 04/06/17 2123)  methocarbamol (ROBAXIN) tablet 1,000 mg (1,000 mg Oral Given 04/06/17 2123)     ____________________________________________   INITIAL  IMPRESSION / ASSESSMENT AND PLAN / ED COURSE  Pertinent labs & imaging results that were available during my care of the patient were reviewed by me and considered in my medical decision making (see chart for details).  Review of the [place] CSRS was performed in accordance of the [facility] prior to dispensing any controlled drugs.     Patient's diagnosis is consistent with Cervical radiculopathy, cervical strain, impingement syndrome of the left shoulder. No acute injury requiring x-rays at this time. Exam is reassuring.. Patient will be discharged home with prescriptions for anti-inflammatory muscle relaxer. Patient is to follow up with orthopedics as needed or otherwise directed. Patient is given ED precautions to return to the ED for any worsening or new symptoms.     ____________________________________________  FINAL CLINICAL IMPRESSION(S) / ED DIAGNOSES  Final diagnoses:  Cervical radiculopathy  Acute strain of neck muscle, initial encounter  Impingement syndrome of left shoulder      NEW MEDICATIONS STARTED DURING THIS VISIT:  New Prescriptions   MELOXICAM (MOBIC) 15 MG TABLET    Take 1 tablet (15 mg total) by mouth daily.   METHOCARBAMOL (ROBAXIN) 500 MG TABLET    Take 1 tablet (500 mg total) by  mouth 4 (four) times daily.        This chart was dictated using voice recognition software/Dragon. Despite best efforts to proofread, errors can occur which can change the meaning. Any change was purely unintentional.    Racheal Patches, PA-C 04/06/17 2130    Merrily Brittle, MD 04/06/17 [phone]

## 2017-04-06 NOTE — ED Notes (Signed)
See triage note  States he was trying to pick up something from his bed  Reached back  Felt a pop to left shoulder  States pain eased off  But then returned when he tried to pull on a vanity  No deformity noted  But increased pain with movement  Pain radiates from neck into shoulder

## 2017-04-06 NOTE — ED Notes (Signed)

## 2017-04-06 NOTE — ED Triage Notes (Signed)
Pt to ED reporting this morning he "threw" his left arm back when he rolled over in bed and heard a "pop" Pt then reports he was pulling something from his truck and heard another "pop" and now can not raise left arm and is having shooting ptain from left neck through left shoulder and down to left elbow. Sensation intact. Color appropriate. Pt able to move fingers without difficulty.

## 2017-05-11 ENCOUNTER — Emergency Department: Payer: Self-pay

## 2017-05-11 ENCOUNTER — Emergency Department
Admission: EM | Admit: 2017-05-11 | Discharge: 2017-05-11 | Disposition: A | Discharge status: Home / Self Care | Payer: Self-pay | Attending: Emergency Medicine | Admitting: Emergency Medicine

## 2017-05-11 ENCOUNTER — Telehealth: Payer: Self-pay | Admitting: Urology

## 2017-05-11 ENCOUNTER — Encounter: Payer: Self-pay | Admitting: Emergency Medicine

## 2017-05-11 DIAGNOSIS — Z79899 Other long term (current) drug therapy: Secondary | ICD-10-CM | POA: Insufficient documentation

## 2017-05-11 DIAGNOSIS — N50819 Testicular pain, unspecified: Secondary | ICD-10-CM

## 2017-05-11 DIAGNOSIS — F17210 Nicotine dependence, cigarettes, uncomplicated: Secondary | ICD-10-CM | POA: Insufficient documentation

## 2017-05-11 DIAGNOSIS — N50812 Left testicular pain: Secondary | ICD-10-CM | POA: Insufficient documentation

## 2017-05-11 LAB — COMPREHENSIVE METABOLIC PANEL
ALBUMIN: 4.6 g/dL (ref 3.5–5.0)
ALK PHOS: 78 U/L (ref 38–126)
ALT: 70 U/L — ABNORMAL HIGH (ref 17–63)
ANION GAP: 9 (ref 5–15)
AST: 42 U/L — ABNORMAL HIGH (ref 15–41)
BUN: 12 mg/dL (ref 6–20)
CALCIUM: 9.3 mg/dL (ref 8.9–10.3)
CHLORIDE: 101 mmol/L (ref 101–111)
CO2: 28 mmol/L (ref 22–32)
Creatinine, Ser: 0.88 mg/dL (ref 0.61–1.24)
GFR calc non Af Amer: >60 mL/min (ref >60)
GLUCOSE: 92 mg/dL (ref 65–99)
POTASSIUM: 3.8 mmol/L (ref 3.5–5.1)
SODIUM: 138 mmol/L (ref 135–145)
Total Bilirubin: 0.7 mg/dL (ref 0.3–1.2)
Total Protein: 8.2 g/dL — ABNORMAL HIGH (ref 6.5–8.1)

## 2017-05-11 LAB — CBC WITH DIFFERENTIAL/PLATELET
BASOS ABS: 0.1 10*3/uL (ref 0–0.1)
BASOS PCT: 1 %
EOS PCT: 2 %
Eosinophils Absolute: 0.1 10*3/uL (ref 0–0.7)
HEMATOCRIT: 45.6 % (ref 40.0–52.0)
Hemoglobin: 15.3 g/dL (ref 13.0–18.0)
Lymphocytes Relative: 43 %
Lymphs Abs: 3.2 10*3/uL (ref 1.0–3.6)
MCH: 30.3 pg (ref 26.0–34.0)
MCHC: 33.5 g/dL (ref 32.0–36.0)
MCV: 90.3 fL (ref 80.0–100.0)
MONO ABS: 0.7 10*3/uL (ref 0.2–1.0)
Monocytes Relative: 9 %
NEUTROS ABS: 3.4 10*3/uL (ref 1.4–6.5)
Neutrophils Relative %: 45 %
PLATELETS: 150 10*3/uL (ref 150–440)
RBC: 5.05 MIL/uL (ref 4.40–5.90)
RDW: 13.1 % (ref 11.5–14.5)
WBC: 7.5 10*3/uL (ref 3.8–10.6)

## 2017-05-11 MED ORDER — IBUPROFEN 400 MG PO TABS
600.0000 mg | ORAL_TABLET | Freq: Once | ORAL | Status: AC
  Administered 2017-05-11: 600 mg via ORAL
  Filled 2017-05-11: qty 2

## 2017-05-11 MED ORDER — OXYCODONE-ACETAMINOPHEN 5-325 MG PO TABS
1.0000 | ORAL_TABLET | Freq: Once | ORAL | Status: AC
  Administered 2017-05-11: 1 via ORAL
  Filled 2017-05-11: qty 1

## 2017-05-11 MED ORDER — OXYCODONE-ACETAMINOPHEN 5-325 MG PO TABS: 1.0000 | ORAL_TABLET | Freq: Four times a day (QID) | ORAL | 0 refills | Status: DC | PRN

## 2017-05-11 MED ORDER — IBUPROFEN 600 MG PO TABS: 600.0000 mg | ORAL_TABLET | Freq: Three times a day (TID) | ORAL | 0 refills | Status: DC | PRN

## 2017-05-11 MED ORDER — SODIUM CHLORIDE 0.9 % IV SOLN
Freq: Once | INTRAVENOUS | Status: AC
  Administered 2017-05-11: 1000 mL/h via INTRAVENOUS

## 2017-05-11 MED ORDER — FENTANYL CITRATE (PF) 100 MCG/2ML IJ SOLN
75.0000 ug | Freq: Once | INTRAMUSCULAR | Status: AC
  Administered 2017-05-11: 75 ug via INTRAVENOUS
  Filled 2017-05-11: qty 2

## 2017-05-11 NOTE — Discharge Instructions (Signed)
Please take your pain medication as needed for severe pain and follow-up with the urologist tomorrow for reexamination. Return to the emergency department sooner for any concerns such as worsening pain, fevers, chills, or for any other issues whatsoever.  It was a pleasure to take care of you today, and thank you for coming to our emergency department.  If you have any questions or concerns before leaving please ask the nurse to grab me and I'm more than happy to go through your aftercare instructions again.  If you were prescribed any opioid pain medication today such as Norco, Vicodin, Percocet, morphine, hydrocodone, or oxycodone please make sure you do not drive when you are taking this medication as it can alter your ability to drive safely.  If you have any concerns once you are home that you are not improving or are in fact getting worse before you can make it to your follow-up appointment, please do not hesitate to call [phone] and come back for further evaluation.  Merrily BrittleNeil Shaelee Forni, MD  Results for orders placed or performed during the hospital encounter of 05/11/17  Comprehensive metabolic panel  Result Value Ref Range   Sodium 138 135 - 145 mmol/L   Potassium 3.8 3.5 - 5.1 mmol/L   Chloride 101 101 - 111 mmol/L   CO2 28 22 - 32 mmol/L   Glucose, Bld 92 65 - 99 mg/dL   BUN 12 6 - 20 mg/dL   Creatinine, Ser 1.610.88 0.61 - 1.24 mg/dL   Calcium 9.3 8.9 - 09.610.3 mg/dL   Total Protein 8.2 (H) 6.5 - 8.1 g/dL   Albumin 4.6 3.5 - 5.0 g/dL   AST 42 (H) 15 - 41 U/L   ALT 70 (H) 17 - 63 U/L   Alkaline Phosphatase 78 38 - 126 U/L   Total Bilirubin 0.7 0.3 - 1.2 mg/dL   GFR calc non Af Amer >60 >60 mL/min   GFR calc Af Amer >60 >60 mL/min   Anion gap 9 5 - 15  CBC with Differential  Result Value Ref Range   WBC 7.5 3.8 - 10.6 K/uL   RBC 5.05 4.40 - 5.90 MIL/uL   Hemoglobin 15.3 13.0 - 18.0 g/dL   HCT 04.545.6 40.940.0 - 81.152.0 %   MCV 90.3 80.0 - 100.0 fL   MCH 30.3 26.0 - 34.0 pg   MCHC 33.5 32.0 -  36.0 g/dL   RDW 91.413.1 78.211.5 - 95.614.5 %   Platelets 150 150 - 440 K/uL   Neutrophils Relative % 45 %   Neutro Abs 3.4 1.4 - 6.5 K/uL   Lymphocytes Relative 43 %   Lymphs Abs 3.2 1.0 - 3.6 K/uL   Monocytes Relative 9 %   Monocytes Absolute 0.7 0.2 - 1.0 K/uL   Eosinophils Relative 2 %   Eosinophils Absolute 0.1 0 - 0.7 K/uL   Basophils Relative 1 %   Basophils Absolute 0.1 0 - 0.1 K/uL   [place] Scrotom W/doppler  Result Date: 05/11/2017 CLINICAL DATA:  Left testicular pain EXAM: SCROTAL ULTRASOUND DOPPLER ULTRASOUND OF THE TESTICLES TECHNIQUE: Complete ultrasound examination of the testicles, epididymis, and other scrotal structures was performed. Color and spectral Doppler ultrasound were also utilized to evaluate blood flow to the testicles. COMPARISON:  None. FINDINGS: Right testicle Measurements: 5.3 x 2.5 x 2.9 cm. No mass or microlithiasis visualized. Left testicle Measurements: 5.4 x 2.5 x 3.5 cm. No mass or microlithiasis visualized. Right epididymis:  Normal in size and appearance. Left epididymis:  Small cyst  measuring 3.1 x 2.7 x 2.5 mm Hydrocele:  Tiny bilateral hydroceles Varicocele:  None visualized. Pulsed Doppler interrogation of both testes demonstrates normal low resistance arterial and venous waveforms bilaterally. IMPRESSION: 1. Negative for testicular torsion or mass 2. Small left epididymal cysts 3. Tiny bilateral hydroceles Electronically Signed   By: Jasmine Pang M.D.   On: 05/11/2017 20:47

## 2017-05-11 NOTE — Telephone Encounter (Signed)
Case discussed this evening with ED doctor, Dr. Lamont Snowballifenbark.    31 year old male with sudden onset of testicular pain after rolling over in bed. Scrotal ultrasound reviewed which shows no scrotal pathology and excellent blood flow, normal duplex study with good waveform without significant improvement in his discomfort  No concern of testicular torsion at this time.    Suspect pain related to mild testicular trauma, intermittent torsion significatnly less likely given nature of pain.  Recommend supportive care at this time, will arrange to see patient in the office in the morning.  Advised ED doc to counseled patient on returning to ER overnight if severe pain returns.   Adrian ScotlandAshley Kaan Tosh, MD

## 2017-05-11 NOTE — ED Provider Notes (Signed)
[facility] Emergency Department Provider Note  ____________________________________________   First MD Initiated Contact with Patient 05/11/17 2040     (approximate)  I have reviewed the triage vital signs and the nursing notes.   HISTORY  Chief Complaint Testicle Pain   HPI Adrian Benson is a 31 y.o. male who comes to the emergency department with severe left testicular pain for the past 5 hours as so. He took a nap on the couch and before going to sleep he felt in his usual state of health. He woke up secondary to extreme pain in his left testicle. The patient has been constant ever since. It does not wax or wane. He has no penile pain. He has no discharge. He has never had pain like this before. He's had no nausea or vomiting. He has had no fevers or chills.his pain seems to be worse when manipulating his testicle and somewhat improved when not.   Past Medical History:  Diagnosis Date  . Hepatitis C     There are no active problems to display for this patient.   History reviewed. No pertinent surgical history.  Prior to Admission medications   Medication Sig Start Date End Date Taking? Authorizing Provider  ibuprofen (ADVIL,MOTRIN) 600 MG tablet Take 1 tablet (600 mg total) by mouth every 8 (eight) hours as needed. 12/21/16   Joni Reining, PA-C  ibuprofen (ADVIL,MOTRIN) 600 MG tablet Take 1 tablet (600 mg total) by mouth every 8 (eight) hours as needed. 05/11/17   Merrily Brittle, MD  ibuprofen (ADVIL,MOTRIN) 800 MG tablet Take 1 tablet (800 mg total) by mouth every 8 (eight) hours as needed for moderate pain. 04/03/16   Irean Hong, MD  meloxicam (MOBIC) 15 MG tablet Take 1 tablet (15 mg total) by mouth daily. 04/06/17   Cuthriell, Delorise Royals, PA-C  methocarbamol (ROBAXIN) 500 MG tablet Take 1 tablet (500 mg total) by mouth 4 (four) times daily. 04/06/17   Cuthriell, Delorise Royals, PA-C  oxyCODONE-acetaminophen (ROXICET) 5-325 MG tablet Take 1-2  tablets by mouth every 4 (four) hours as needed for severe pain. 04/07/16   Beers, Charmayne Sheer, PA-C  oxyCODONE-acetaminophen (ROXICET) 5-325 MG tablet Take 1 tablet by mouth every 6 (six) hours as needed for severe pain. 05/11/17   Merrily Brittle, MD  sulfamethoxazole-trimethoprim (BACTRIM DS,SEPTRA DS) 800-160 MG tablet Take 2 tablets by mouth 2 (two) times daily. 04/03/16   Irean Hong, MD  traMADol (ULTRAM) 50 MG tablet Take 1 tablet (50 mg total) by mouth every 12 (twelve) hours as needed. 12/21/16   Joni Reining, PA-C    Allergies Patient has no known allergies.  No family history on file.  Social History Social History  Substance Use Topics  . Smoking status: Current Every Day Smoker    Packs/day: 1.00    Types: Cigarettes  . Smokeless tobacco: Never Used  . Alcohol use Yes     Comment: occasional    Review of Systems Constitutional: No fever/chills ENT: No sore throat. Cardiovascular: Denies chest pain. Respiratory: Denies shortness of breath. Gastrointestinal: No abdominal pain.  No nausea, no vomiting.  No diarrhea.  No constipation. Musculoskeletal: Negative for back pain. Neurological: Negative for headaches   ____________________________________________   PHYSICAL EXAM:  VITAL SIGNS: ED Triage Vitals  Enc Vitals Group     BP 05/11/17 1954 126/76     Pulse Rate 05/11/17 1954 79     Resp 05/11/17 1954 18     Temp  05/11/17 1954 97.9 F (36.6 C)     Temp Source 05/11/17 1954 Oral     SpO2 05/11/17 1954 99 %     Weight 05/11/17 1954 170 lb (77.1 kg)     Height 05/11/17 1954 6\' 2"  (1.88 m)     Head Circumference --      Peak Flow --      Pain Score 05/11/17 1953 10     Pain Loc --      Pain Edu? --      Excl. in GC? --     Constitutional: alert and oriented 4 appears uncomfortable nontoxic no diaphoresis speaks in full clear sentences Head: Atraumatic. Nose: No congestion/rhinnorhea. Mouth/Throat: No trismus Neck: No stridor.   Cardiovascular:  regular rate and rhythm Respiratory: Normal respiratory effort.  No retractions. Gastrointestinal: soft nondistended nontender no rebound or guarding no peritonitis Normal testes and normal live positive cremasteric reflex laterally exquisitely tender left testicle no erythema or warmth Neurologic:  Normal speech and language. No gross focal neurologic deficits are appreciated.  Skin:  Skin is warm, dry and intact. No rash noted.    ____________________________________________  LABS (all labs ordered are listed, but only abnormal results are displayed)  Labs Reviewed  COMPREHENSIVE METABOLIC PANEL - Abnormal; Notable for the following:       Result Value   Total Protein 8.2 (*)    AST 42 (*)    ALT 70 (*)    All other components within normal limits  CHLAMYDIA/NGC RT PCR ([facility] ONLY)  CBC WITH DIFFERENTIAL/PLATELET  URINALYSIS, COMPLETE (UACMP) WITH MICROSCOPIC    blood work reviewed and interpreted by me shows clinically insignificant transaminitis __________________________________________  EKG   ____________________________________________  RADIOLOGY  testicular ultrasound reviewed by me with normal flow in no acute disease ____________________________________________   DIFFERENTIAL includes but not limited to  testicular torsion, testicular contusion, epididymitis   PROCEDURES  Procedure(s) performed: no  Procedures  Critical Care performed: no  Observation: no ____________________________________________   INITIAL IMPRESSION / ASSESSMENT AND PLAN / ED COURSE  Pertinent labs & imaging results that were available during my care of the patient were reviewed by me and considered in my medical decision making (see chart for details).      ----------------------------------------- 8:53 PM on 05/11/2017 -----------------------------------------  The patient has severe unrelenting left testicular pain began after rolling over in his sleep.  The pain is been ongoing at this point for about 5 hours. His ultrasound is unremarkable, however given the severity of his symptoms I will reach out to urology now.  ----------------------------------------- 8:58 PM on 05/11/2017 -----------------------------------------  I discussed the case with on-call urologist Dr. Apolinar JunesBrandon who personally reviewed the patient's imaging. She feels that the patient most likely did suffer a testicular contusion, however his history and his ultrasound are inconsistent with intermittent testicular torsion. He does not have a Bell clap or deformity, and his pain has been constant. His flow is strong. She would like to see him in clinic tomorrow for reevaluation.  ____________________________________________ ----------------------------------------- 10:38 PM on 05/11/2017 -----------------------------------------  The patient's pain is markedly improved. The patient verbalizes understanding that he is to return to the emergency department tonight for any worsening symptoms but otherwise to follow up with urology tomorrow. He is discharged home in improved condition.  FINAL CLINICAL IMPRESSION(S) / ED DIAGNOSES  Final diagnoses:  Testicular pain, left      NEW MEDICATIONS STARTED DURING THIS VISIT:  New Prescriptions   IBUPROFEN (ADVIL,MOTRIN) 600 MG TABLET  Take 1 tablet (600 mg total) by mouth every 8 (eight) hours as needed.   OXYCODONE-ACETAMINOPHEN (ROXICET) 5-325 MG TABLET    Take 1 tablet by mouth every 6 (six) hours as needed for severe pain.     Note:  This document was prepared using Dragon voice recognition software and may include unintentional dictation errors.      Merrily Brittle, MD 05/11/17 2238

## 2017-05-11 NOTE — ED Triage Notes (Addendum)
Pt to triage via w/c with no distress noted; c/o left testicular pain, no swelling x 4-5hours with no accomp symptoms and no known cause

## 2017-05-12 NOTE — Telephone Encounter (Signed)
I called and spoke with the patient and he was not able to come in today due to not having any money for gas. He said he would just take his pain meds until he needed more and then he would call us for an appointment if needed.   Adrian DusterMichelle

## 2018-02-07 ENCOUNTER — Other Ambulatory Visit: Payer: Self-pay

## 2018-02-07 ENCOUNTER — Emergency Department
Admission: EM | Admit: 2018-02-07 | Discharge: 2018-02-07 | Disposition: A | Discharge status: Home / Self Care | Payer: Self-pay | Attending: Emergency Medicine | Admitting: Emergency Medicine

## 2018-02-07 ENCOUNTER — Encounter: Payer: Self-pay | Admitting: Emergency Medicine

## 2018-02-07 DIAGNOSIS — F17210 Nicotine dependence, cigarettes, uncomplicated: Secondary | ICD-10-CM | POA: Insufficient documentation

## 2018-02-07 DIAGNOSIS — H60319 Diffuse otitis externa, unspecified ear: Secondary | ICD-10-CM

## 2018-02-07 DIAGNOSIS — H60313 Diffuse otitis externa, bilateral: Secondary | ICD-10-CM | POA: Insufficient documentation

## 2018-02-07 MED ORDER — NEOMYCIN-POLYMYXIN-HC 3.5-10000-1 OT SOLN: OTIC | 0 refills | Status: DC

## 2018-02-07 MED ORDER — HYDROCODONE-ACETAMINOPHEN 5-325 MG PO TABS
1.0000 | ORAL_TABLET | Freq: Once | ORAL | Status: AC
  Administered 2018-02-07: 1 via ORAL
  Filled 2018-02-07: qty 1

## 2018-02-07 MED ORDER — AMOXICILLIN 500 MG PO CAPS: 500.0000 mg | ORAL_CAPSULE | Freq: Three times a day (TID) | ORAL | 0 refills | Status: DC

## 2018-02-07 NOTE — Discharge Instructions (Signed)
Follow-up with your regular doctor or [facility] if you are not better in 3 to 5 days.  Return emergency department if worsening.  Once the ear infection has calm down please buy Debrox over-the-counter to wash the remaining wax and exudate out of your ears.  Take the medications as prescribed

## 2018-02-07 NOTE — ED Triage Notes (Signed)
Pt presents to ED with c/o bilateral ear pain the past couple of days. Pt reports drainage from both ears today.

## 2018-02-07 NOTE — ED Provider Notes (Signed)
[facility] Emergency Department Provider Note  ____________________________________________   First MD Initiated Contact with Patient 02/07/18 1937     (approximate)  I have reviewed the triage vital signs and the nursing notes.   HISTORY  Chief Complaint Otalgia    HPI Adrian Benson is a 32 y.o. male's emergency department complaining of bilateral ear pain for 2 days.  He states he has drainage from both ears today.  He denies any fever or chills.  Denies any decreased hearing.  He denies any recent swimming.  Past Medical History:  Diagnosis Date  . Hepatitis C     There are no active problems to display for this patient.   History reviewed. No pertinent surgical history.  Prior to Admission medications   Medication Sig Start Date End Date Taking? Authorizing Provider  amoxicillin (AMOXIL) 500 MG capsule Take 1 capsule (500 mg total) by mouth 3 (three) times daily. 02/07/18   Sherrie Mustache Roselyn Bering, PA-C  neomycin-polymyxin-hydrocortisone (CORTISPORIN) OTIC solution Fill ear canal tid for 5 days 02/07/18   Faythe Ghee, PA-C    Allergies Patient has no known allergies.  No family history on file.  Social History Social History   Tobacco Use  . Smoking status: Current Every Day Smoker    Packs/day: 1.00    Types: Cigarettes  . Smokeless tobacco: Never Used  Substance Use Topics  . Alcohol use: Yes    Comment: occasional  . Drug use: Yes    Types: Marijuana    Review of Systems  Constitutional: No fever/chills Eyes: No visual changes. ENT: No sore throat.  Positive for bilateral ear pain Respiratory: Denies cough Genitourinary: Negative for dysuria. Musculoskeletal: Negative for back pain. Skin: Negative for rash.    ____________________________________________   PHYSICAL EXAM:  VITAL SIGNS: ED Triage Vitals [02/07/18 1930]  Enc Vitals Group     BP 123/85     Pulse Rate 97     Resp 18     Temp 99 F (37.2 C)   Temp Source Oral     SpO2 99 %     Weight 165 lb (74.8 kg)     Height 6\' 2"  (1.88 m)     Head Circumference      Peak Flow      Pain Score 8     Pain Loc      Pain Edu?      Excl. in GC?     Constitutional: Alert and oriented. Well appearing and in no acute distress. Eyes: Conjunctivae are normal.  Head: Atraumatic. Ears: Both ear canals are full of exudate and wax.  The ear canals are bright red and swollen.  Patient has a significant amount of pain upon exam.  I am unable to see the TMs at this time. Nose: No congestion/rhinnorhea. Mouth/Throat: Mucous membranes are moist.   Neck: Is supple, no lymphadenopathy is noted Cardiovascular: Normal rate, regular rhythm.  Heart sounds are normal Respiratory: Normal respiratory effort.  No retractions, lungs clear to auscultation GU: deferred Musculoskeletal: FROM all extremities, warm and well perfused Neurologic:  Normal speech and language.  Skin:  Skin is warm, dry and intact. No rash noted. Psychiatric: Mood and affect are normal. Speech and behavior are normal.  ____________________________________________   LABS (all labs ordered are listed, but only abnormal results are displayed)  Labs Reviewed - No data to display ____________________________________________   ____________________________________________  RADIOLOGY    ____________________________________________   PROCEDURES  Procedure(s) performed: No  Procedures    ____________________________________________   INITIAL IMPRESSION / ASSESSMENT AND PLAN / ED COURSE  Pertinent labs & imaging results that were available during my care of the patient were reviewed by me and considered in my medical decision making (see chart for details).  Patient is a 32 year old male presents emergency department complaining of bilateral ear pain.  He states he has had pain for 2 days and has drainage out of both ears.  On physical exam both ear canals are red and  swollen.  There is a large amount of wax and exudate noted in the ear canals.  The patient has significant pain upon exam with the otoscope.  Explained the findings to the patient.  He was given a prescription for amoxicillin and Cortisporin.  He was given 1 hydrocodone while here in the ED.  Explained to him that this does not qualify as type of illness or he should have to have a narcotic therefore he should take Tylenol and ibuprofen.  He states he understands.  He will comply with our instructions.  Was discharged in stable condition     As part of my medical decision making, I reviewed the following data within the electronic MEDICAL RECORD NUMBER Nursing notes reviewed and incorporated, Old chart reviewed, Notes from prior ED visits and [place] Controlled Substance Database  ____________________________________________   FINAL CLINICAL IMPRESSION(S) / ED DIAGNOSES  Final diagnoses:  Acute diffuse otitis externa, unspecified laterality      NEW MEDICATIONS STARTED DURING THIS VISIT:  Discharge Medication List as of 02/07/2018  7:44 PM    START taking these medications   Details  amoxicillin (AMOXIL) 500 MG capsule Take 1 capsule (500 mg total) by mouth 3 (three) times daily., Starting Tue 02/07/2018, Print    neomycin-polymyxin-hydrocortisone (CORTISPORIN) OTIC solution Fill ear canal tid for 5 days, Print         Note:  This document was prepared using Dragon voice recognition software and may include unintentional dictation errors.    Faythe GheeFisher, Savvy Peeters W, PA-C 02/07/18 [place] [id number]    Goodman, Graydon, MD 02/07/18 2119

## 2018-06-11 ENCOUNTER — Encounter: Payer: Self-pay | Admitting: Emergency Medicine

## 2018-06-11 ENCOUNTER — Emergency Department
Admission: EM | Admit: 2018-06-11 | Discharge: 2018-06-11 | Disposition: A | Discharge status: Home / Self Care | Payer: Self-pay | Attending: Emergency Medicine | Admitting: Emergency Medicine

## 2018-06-11 ENCOUNTER — Other Ambulatory Visit: Payer: Self-pay

## 2018-06-11 DIAGNOSIS — R05 Cough: Secondary | ICD-10-CM | POA: Insufficient documentation

## 2018-06-11 DIAGNOSIS — Z5321 Procedure and treatment not carried out due to patient leaving prior to being seen by health care provider: Secondary | ICD-10-CM | POA: Insufficient documentation

## 2018-06-11 DIAGNOSIS — R509 Fever, unspecified: Secondary | ICD-10-CM | POA: Insufficient documentation

## 2018-06-11 DIAGNOSIS — R52 Pain, unspecified: Secondary | ICD-10-CM | POA: Insufficient documentation

## 2018-06-11 DIAGNOSIS — R1110 Vomiting, unspecified: Secondary | ICD-10-CM | POA: Insufficient documentation

## 2018-06-11 LAB — COMPREHENSIVE METABOLIC PANEL
ALT: 64 U/L — ABNORMAL HIGH (ref 0–44)
ANION GAP: 9 (ref 5–15)
AST: 59 U/L — ABNORMAL HIGH (ref 15–41)
Albumin: 4.3 g/dL (ref 3.5–5.0)
Alkaline Phosphatase: 80 U/L (ref 38–126)
BILIRUBIN TOTAL: 0.6 mg/dL (ref 0.3–1.2)
BUN: 13 mg/dL (ref 6–20)
CO2: 26 mmol/L (ref 22–32)
Calcium: 9.5 mg/dL (ref 8.9–10.3)
Chloride: 100 mmol/L (ref 98–111)
Creatinine, Ser: 0.91 mg/dL (ref 0.61–1.24)
GFR calc Af Amer: >60 mL/min (ref >60)
GFR calc non Af Amer: >60 mL/min (ref >60)
GLUCOSE: 124 mg/dL — AB (ref 70–99)
POTASSIUM: 4.1 mmol/L (ref 3.5–5.1)
Sodium: 135 mmol/L (ref 135–145)
TOTAL PROTEIN: 8.3 g/dL — AB (ref 6.5–8.1)

## 2018-06-11 LAB — CBC
HCT: 44 % (ref 39.0–52.0)
HEMOGLOBIN: 15.1 g/dL (ref 13.0–17.0)
MCH: 30 pg (ref 26.0–34.0)
MCHC: 34.3 g/dL (ref 30.0–36.0)
MCV: 87.3 fL (ref 80.0–100.0)
Platelets: 129 10*3/uL — ABNORMAL LOW (ref 150–400)
RBC: 5.04 MIL/uL (ref 4.22–5.81)
RDW: 12.7 % (ref 11.5–15.5)
WBC: 8.1 10*3/uL (ref 4.0–10.5)
nRBC: 0 % (ref 0.0–0.2)

## 2018-06-11 LAB — LIPASE, BLOOD: LIPASE: 27 U/L (ref 11–51)

## 2018-06-11 NOTE — ED Notes (Signed)
Pt reported that he is sob and cant breathe- pt does not appear to be in any resp distress- VS obtained. 98%RA

## 2018-06-11 NOTE — ED Triage Notes (Addendum)
Pt arrived via POV with reports of generalized body aches, chest congestion and at times productive cough.  Pt reports decreased PO intake and subjective fevers. Pt took liquid ibuprofen around 0400-0500 this morning.   Pt states he had a few days of vomiting, but hasn't vomiting in a couple of days.

## 2018-06-12 ENCOUNTER — Telehealth: Payer: Self-pay | Admitting: Emergency Medicine

## 2018-06-12 NOTE — Telephone Encounter (Signed)
Called patient due to lwot to inquire about condition and follow up plans. Patient says he still feels bad.  He does not have pcp and does not have insurance.  I explained options for care including [facility] and [facility].  I told him that he can always return here.  He says he thinks he has the flu and now another household member has same symptoms.  Says he will return if he needs.

## 2018-11-13 IMAGING — DX DG KNEE COMPLETE 4+V*R*
4 series · 4 of 4 positions shown · non-contrast
Comparison: None.

CLINICAL DATA: Right knee pain after fall

EXAM:
RIGHT KNEE - COMPLETE 4+ VIEW

[knee ap]
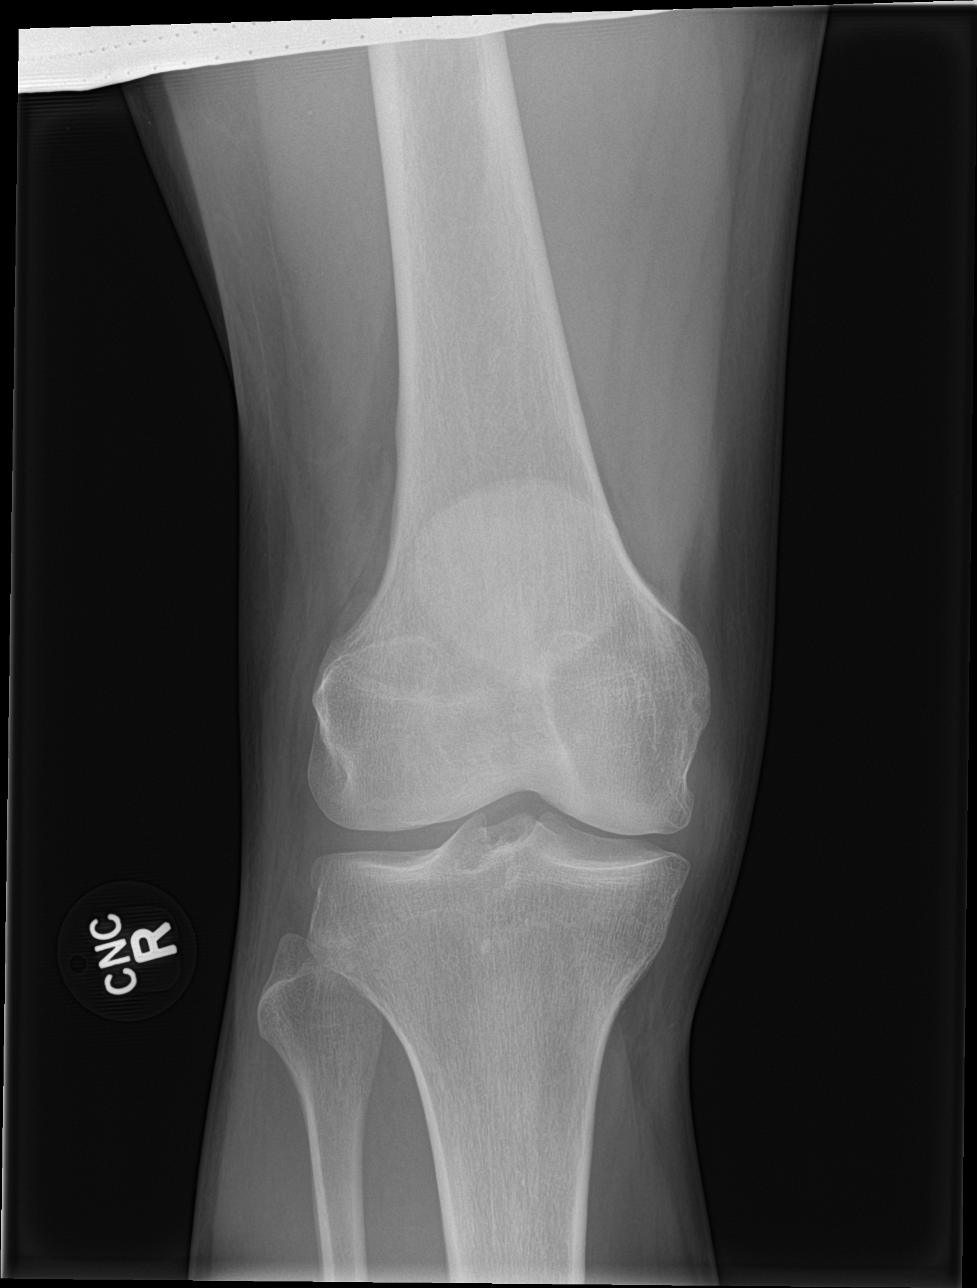

[knee tunnel]
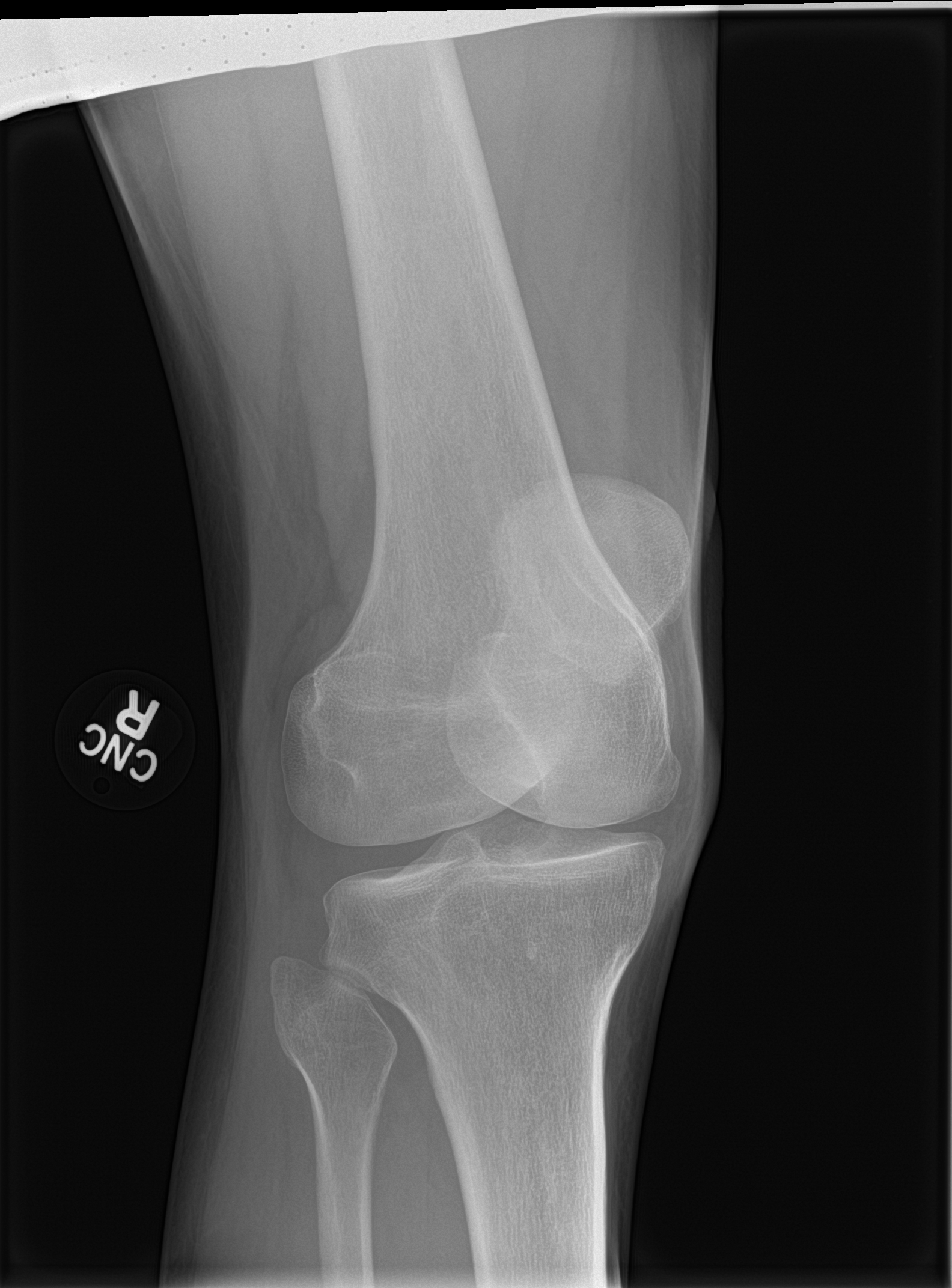

[knee lat]
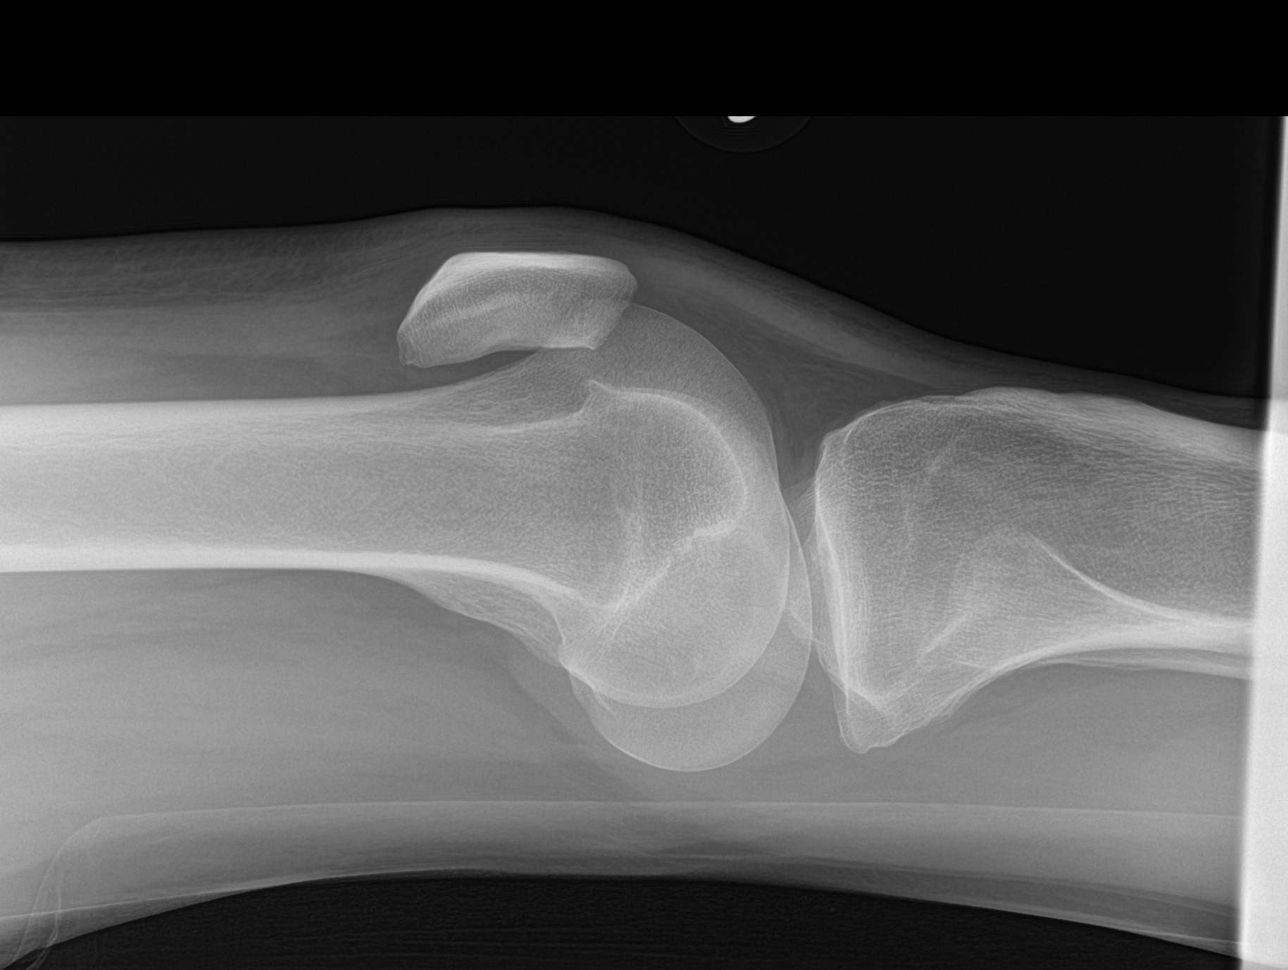

[knee obl]
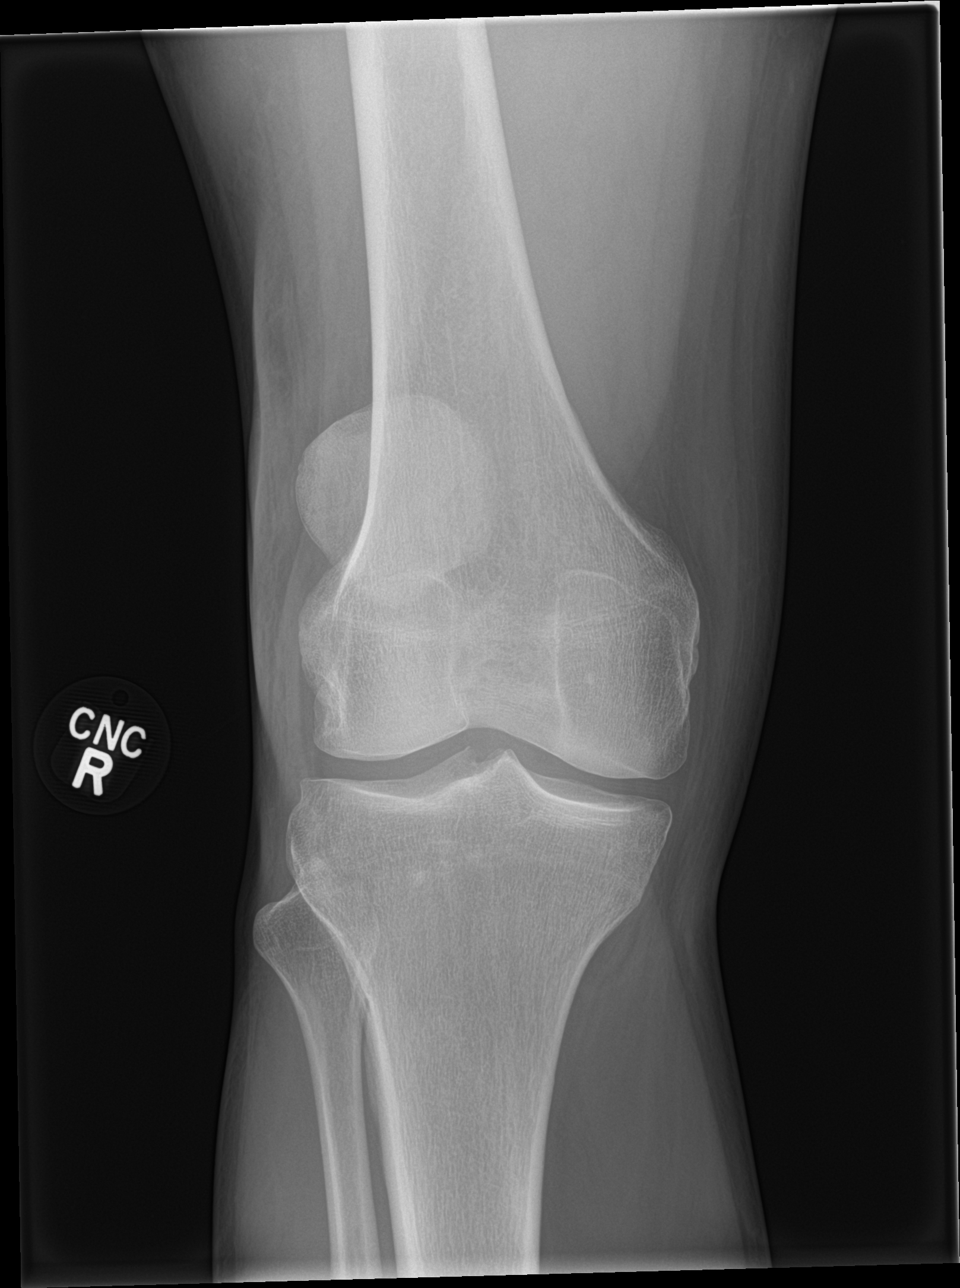

[4 of 4 positions shown; findings below may reference images not displayed]

FINDINGS: No evidence of fracture, dislocation, or joint effusion. A few
subchondral cystic lucencies are noted adjacent to the tibial spine
on the PA view. Findings may be related to mild degenerative change.
No evidence of arthropathy or other focal bone abnormality. Soft
tissues are unremarkable.
IMPRESSION: No joint effusion, dislocations nor apparent fracture of the right
knee.

## 2019-01-09 ENCOUNTER — Encounter: Payer: Self-pay | Admitting: Emergency Medicine

## 2019-01-09 ENCOUNTER — Other Ambulatory Visit: Payer: Self-pay

## 2019-01-09 ENCOUNTER — Emergency Department
Admission: EM | Admit: 2019-01-09 | Discharge: 2019-01-09 | Disposition: A | Discharge status: Home / Self Care | Payer: Self-pay | Attending: Emergency Medicine | Admitting: Emergency Medicine

## 2019-01-09 DIAGNOSIS — Y9389 Activity, other specified: Secondary | ICD-10-CM | POA: Insufficient documentation

## 2019-01-09 DIAGNOSIS — Y929 Unspecified place or not applicable: Secondary | ICD-10-CM | POA: Insufficient documentation

## 2019-01-09 DIAGNOSIS — W57XXXA Bitten or stung by nonvenomous insect and other nonvenomous arthropods, initial encounter: Secondary | ICD-10-CM | POA: Insufficient documentation

## 2019-01-09 DIAGNOSIS — L0291 Cutaneous abscess, unspecified: Secondary | ICD-10-CM

## 2019-01-09 DIAGNOSIS — Y998 Other external cause status: Secondary | ICD-10-CM | POA: Insufficient documentation

## 2019-01-09 DIAGNOSIS — S30861A Insect bite (nonvenomous) of abdominal wall, initial encounter: Secondary | ICD-10-CM | POA: Insufficient documentation

## 2019-01-09 DIAGNOSIS — L02214 Cutaneous abscess of groin: Secondary | ICD-10-CM | POA: Insufficient documentation

## 2019-01-09 DIAGNOSIS — F17210 Nicotine dependence, cigarettes, uncomplicated: Secondary | ICD-10-CM | POA: Insufficient documentation

## 2019-01-09 MED ORDER — DOXYCYCLINE HYCLATE 100 MG PO CAPS: 100.0000 mg | ORAL_CAPSULE | Freq: Two times a day (BID) | ORAL | 0 refills | Status: DC

## 2019-01-09 NOTE — ED Provider Notes (Signed)
[facility] Emergency Department Provider Note  ____________________________________________   First MD Initiated Contact with Patient 01/09/19 1300     (approximate)  I have reviewed the triage vital signs and the nursing notes.   HISTORY  Chief Complaint Insect Bite    HPI Evalee MuttonBrian L Culhane is a 33 y.o. male presents emergency department with complaints of a tick bite to the groin area.  He states now he thinks the skin abscess related was.  He denies any fever chills.  Denies any chest pain shortness of breath.    Past Medical History:  Diagnosis Date  . Hepatitis C     There are no active problems to display for this patient.   History reviewed. No pertinent surgical history.  Prior to Admission medications   Medication Sig Start Date End Date Taking? Authorizing Provider  doxycycline (VIBRAMYCIN) 100 MG capsule Take 1 capsule (100 mg total) by mouth 2 (two) times daily. 01/09/19   Faythe GheeFisher, Susan W, PA-C    Allergies Patient has no known allergies.  History reviewed. No pertinent family history.  Social History Social History   Tobacco Use  . Smoking status: Current Every Day Smoker    Packs/day: 1.00    Types: Cigarettes  . Smokeless tobacco: Never Used  Substance Use Topics  . Alcohol use: Yes    Comment: occasional  . Drug use: Yes    Types: Marijuana    Review of Systems  Constitutional: No fever/chills Eyes: No visual changes. ENT: No sore throat. Respiratory: Denies cough Genitourinary: Negative for dysuria. Musculoskeletal: Negative for back pain. Skin: Negative for rash.  Positive for tick bite and questionable abscess    ____________________________________________   PHYSICAL EXAM:  VITAL SIGNS: ED Triage Vitals  Enc Vitals Group     BP 01/09/19 1235 129/90     Pulse Rate 01/09/19 1235 97     Resp 01/09/19 1235 20     Temp 01/09/19 1235 98.3 F (36.8 C)     Temp Source 01/09/19 1235 Oral     SpO2  01/09/19 1235 99 %     Weight 01/09/19 1231 175 lb (79.4 kg)     Height 01/09/19 1231 6\' 2"  (1.88 m)     Head Circumference --      Peak Flow --      Pain Score 01/09/19 1231 7     Pain Loc --      Pain Edu? --      Excl. in GC? --     Constitutional: Alert and oriented. Well appearing and in no acute distress. Eyes: Conjunctivae are normal.  Head: Atraumatic. Nose: No congestion/rhinnorhea. Mouth/Throat: Mucous membranes are moist.   Neck:  supple no lymphadenopathy noted Cardiovascular: Normal rate, regular rhythm. Heart sounds are normal Respiratory: Normal respiratory effort.  No retractions, lungs c t a  GU: Small abscess noted in the pubic area Musculoskeletal: FROM all extremities, warm and well perfused Neurologic:  Normal speech and language.  Skin:  Skin is warm, dry and intact. No rash noted. Psychiatric: Mood and affect are normal. Speech and behavior are normal.  ____________________________________________   LABS (all labs ordered are listed, but only abnormal results are displayed)  Labs Reviewed - No data to display ____________________________________________   ____________________________________________  RADIOLOGY    ____________________________________________   PROCEDURES  Procedure(s) performed: No  Procedures    ____________________________________________   INITIAL IMPRESSION / ASSESSMENT AND PLAN / ED COURSE  Pertinent labs & imaging results that  were available during my care of the patient were reviewed by me and considered in my medical decision making (see chart for details).   Patient is 33 year old male presents emergency department with complaints of a tick bite with questionable abscess.  Physical exam shows that patient appears well.  There is a small abscess noted in the pubic area.  Explained findings to the patient.  He was given a prescription for doxycycline.  He is to return emergency department worsening.  States he  understands will comply appears discharged stable condition.     As part of my medical decision making, I reviewed the following data within the [place] notes reviewed and incorporated, Old chart reviewed, Notes from prior ED visits and [place] Controlled Substance Database  ____________________________________________   FINAL CLINICAL IMPRESSION(S) / ED DIAGNOSES  Final diagnoses:  Tick bite, initial encounter  Abscess      NEW MEDICATIONS STARTED DURING THIS VISIT:  New Prescriptions   DOXYCYCLINE (VIBRAMYCIN) 100 MG CAPSULE    Take 1 capsule (100 mg total) by mouth 2 (two) times daily.     Note:  This document was prepared using Dragon voice recognition software and may include unintentional dictation errors.    Versie Starks, PA-C 01/09/19 1319    Delman Kitten, MD 01/09/19 [phone]

## 2019-01-09 NOTE — ED Triage Notes (Signed)
Pt states he was bit by a [place] tick in his right groin on Sunday and now has swelling to his groin.

## 2019-01-09 NOTE — Discharge Instructions (Addendum)
Apply warm compress to the affected area.  Take antibiotic as prescribed.  Return emergency department worsening.

## 2019-01-09 NOTE — ED Notes (Signed)
See triage note  States he found a tick in groin   Now area is painful and swollen

## 2019-05-31 ENCOUNTER — Other Ambulatory Visit: Payer: Self-pay

## 2019-05-31 ENCOUNTER — Encounter: Payer: Self-pay | Admitting: Emergency Medicine

## 2019-05-31 DIAGNOSIS — Y929 Unspecified place or not applicable: Secondary | ICD-10-CM | POA: Insufficient documentation

## 2019-05-31 DIAGNOSIS — X088XXA Exposure to other specified smoke, fire and flames, initial encounter: Secondary | ICD-10-CM | POA: Insufficient documentation

## 2019-05-31 DIAGNOSIS — Y999 Unspecified external cause status: Secondary | ICD-10-CM | POA: Insufficient documentation

## 2019-05-31 DIAGNOSIS — Y9389 Activity, other specified: Secondary | ICD-10-CM | POA: Insufficient documentation

## 2019-05-31 DIAGNOSIS — Z5321 Procedure and treatment not carried out due to patient leaving prior to being seen by health care provider: Secondary | ICD-10-CM | POA: Insufficient documentation

## 2019-05-31 NOTE — ED Triage Notes (Signed)
Patient burned left hand with butane touch while trying to light cigarette. Patient has blistering and just states his hand really hurts and want some cream to help with pain.

## 2019-06-01 ENCOUNTER — Emergency Department
Admission: EM | Admit: 2019-06-01 | Discharge: 2019-06-01 | Disposition: A | Discharge status: Home / Self Care | Payer: Self-pay | Attending: Emergency Medicine | Admitting: Emergency Medicine

## 2019-06-01 NOTE — ED Notes (Signed)
No answer when called several times from lobby 

## 2019-08-27 IMAGING — US US SCROTUM W/ DOPPLER COMPLETE
1 series · 14 of 25 positions shown · non-contrast
Comparison: None.

CLINICAL DATA: Left testicular pain

EXAM:
SCROTAL ULTRASOUND
DOPPLER ULTRASOUND OF THE TESTICLES
TECHNIQUE: Complete ultrasound examination of the testicles, epididymis, and
other scrotal structures was performed. Color and spectral Doppler
ultrasound were also utilized to evaluate blood flow to the
testicles.

[Series 1: us scrotum w/ doppler complete · 0.08mm/px · 14 of 54 slices shown]
[im 1/54]
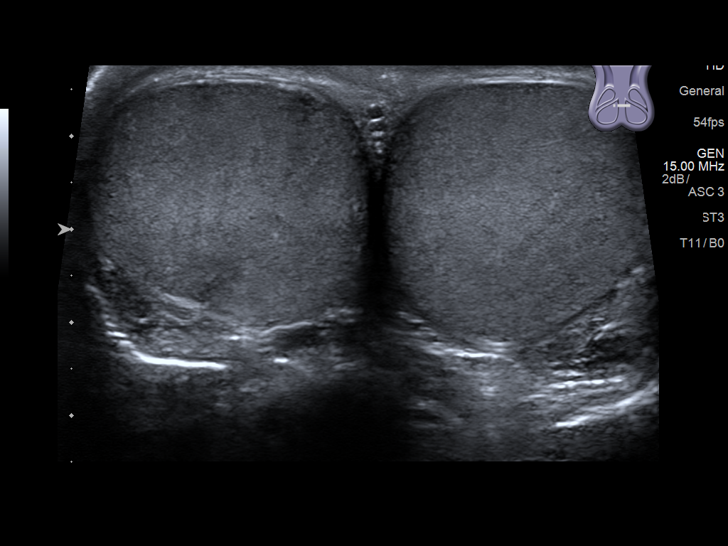
[im 5/54]
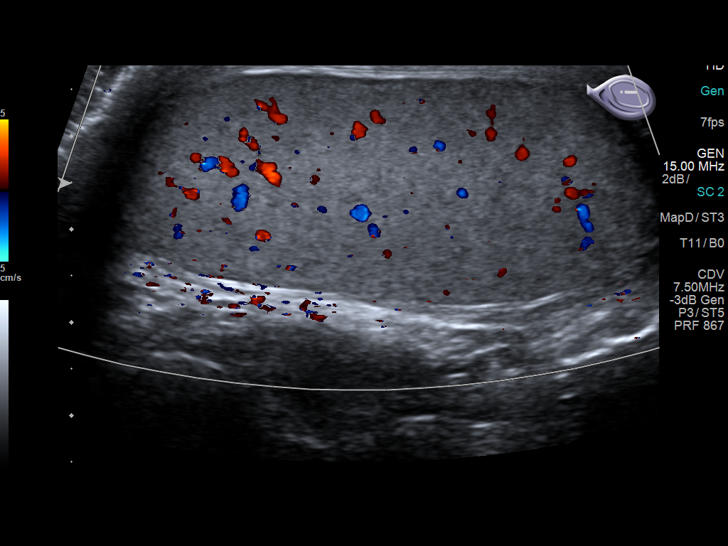
[im 9/54]
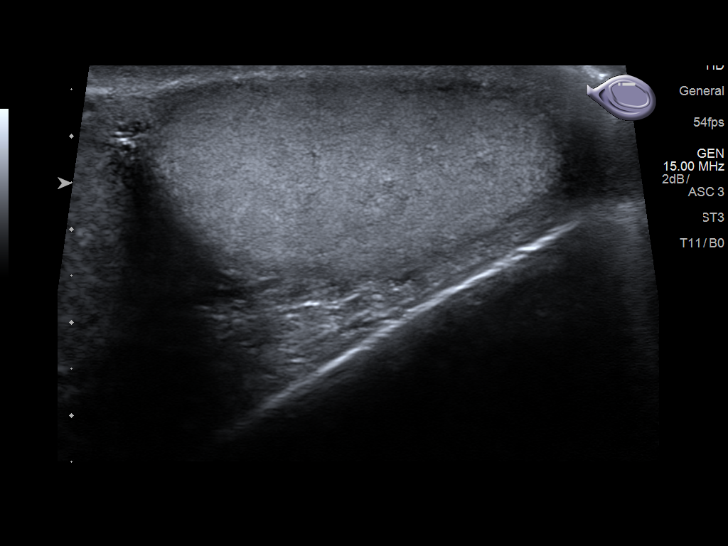
[im 14/54]
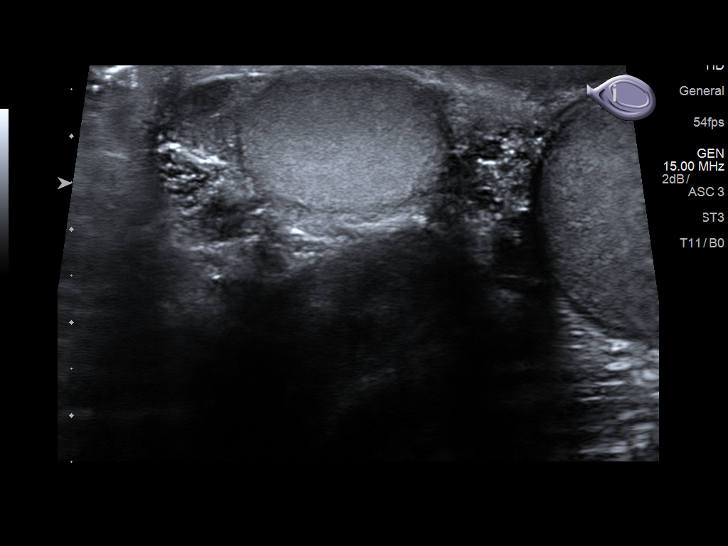
[im 18/54]
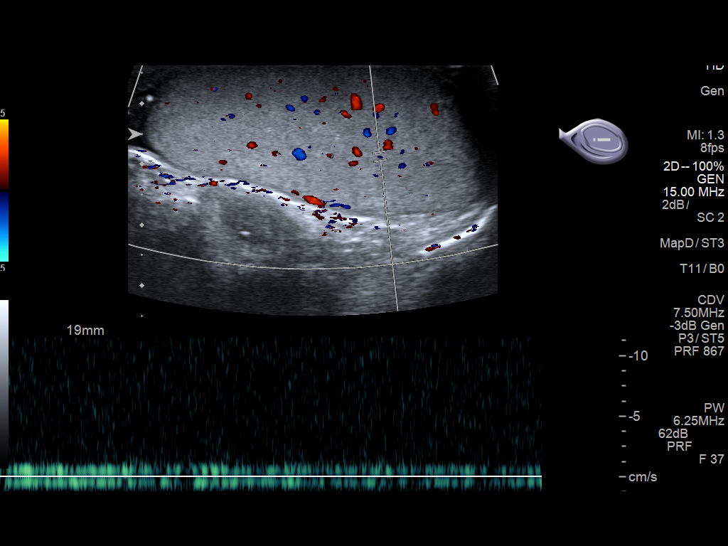
[im 20/54]
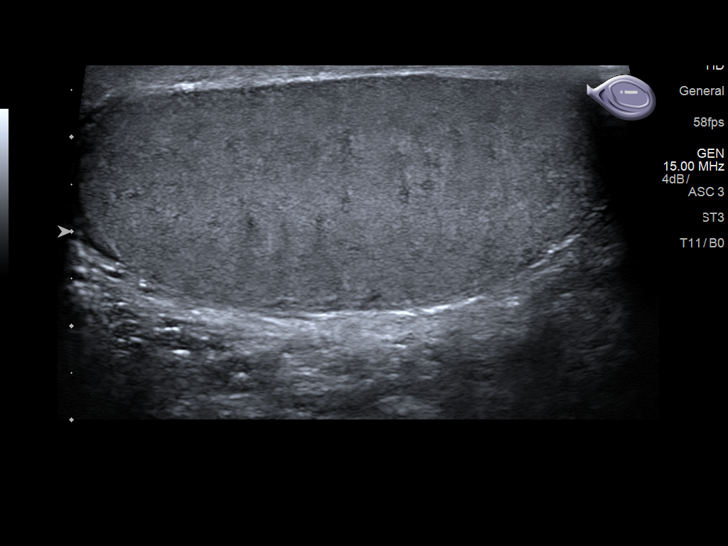
[im 25/54]
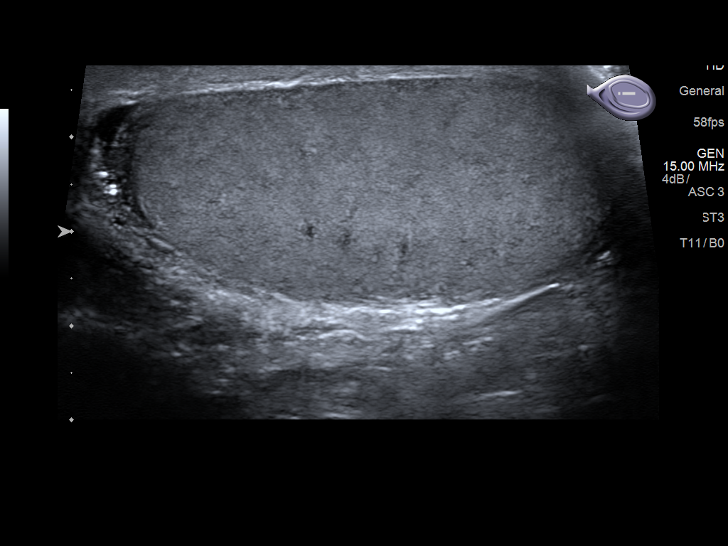
[im 29/54]
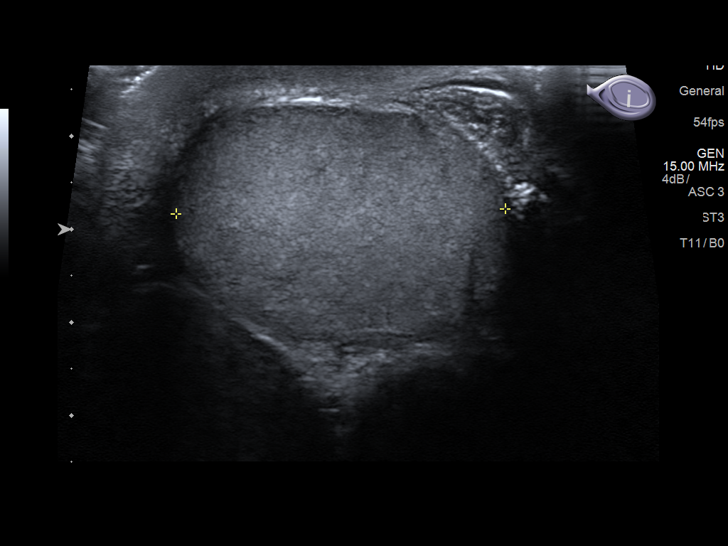
[im 34/54]
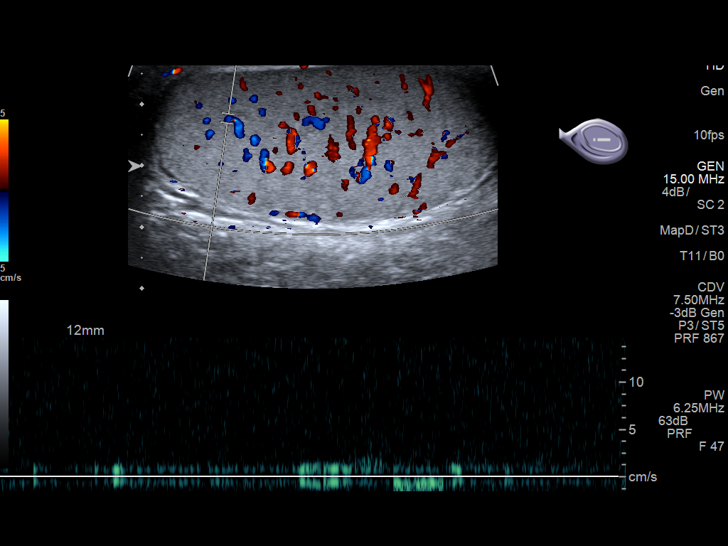
[im 36/54]
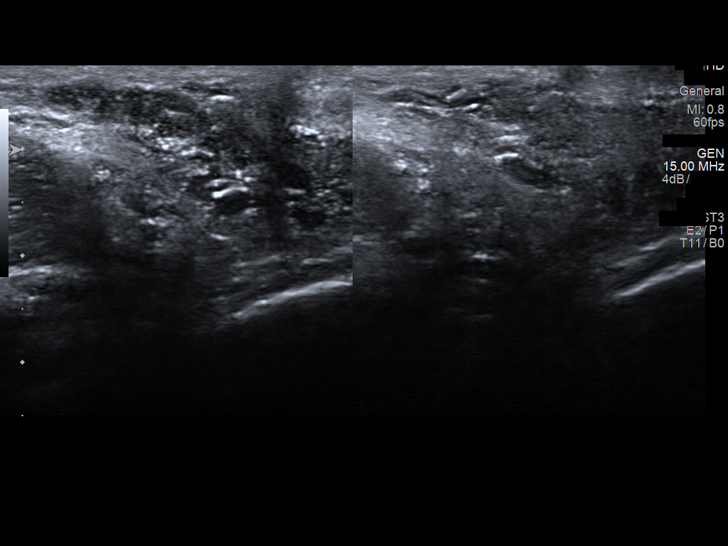
[im 40/54]
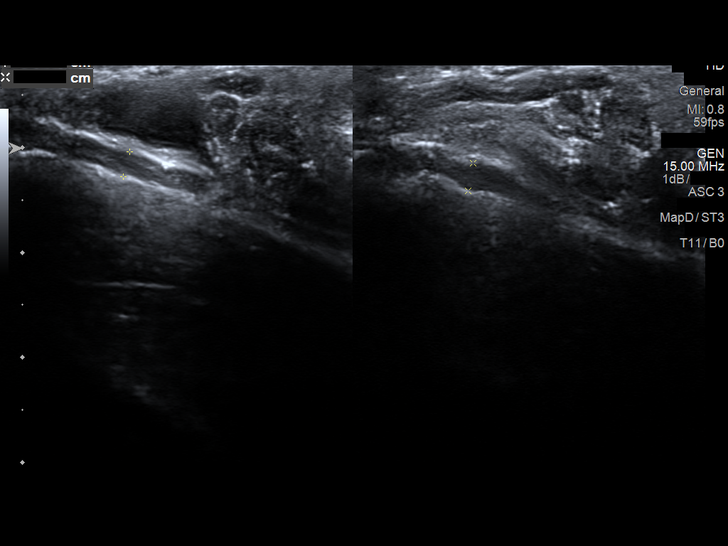
[im 45/54]
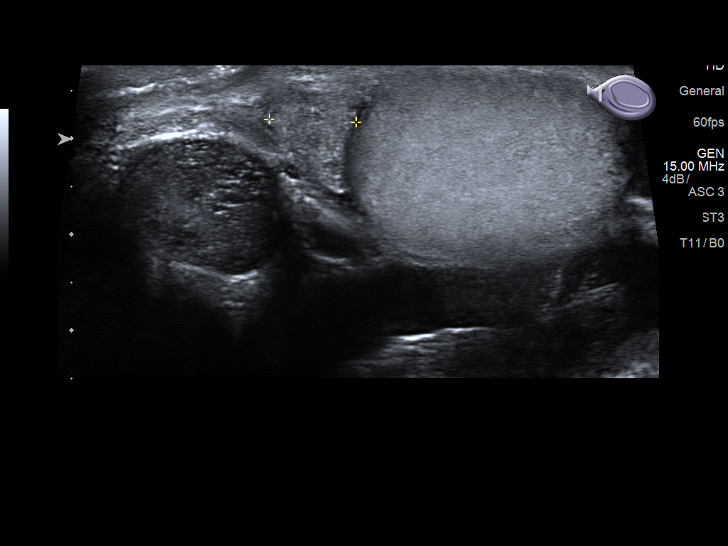
[im 49/54]
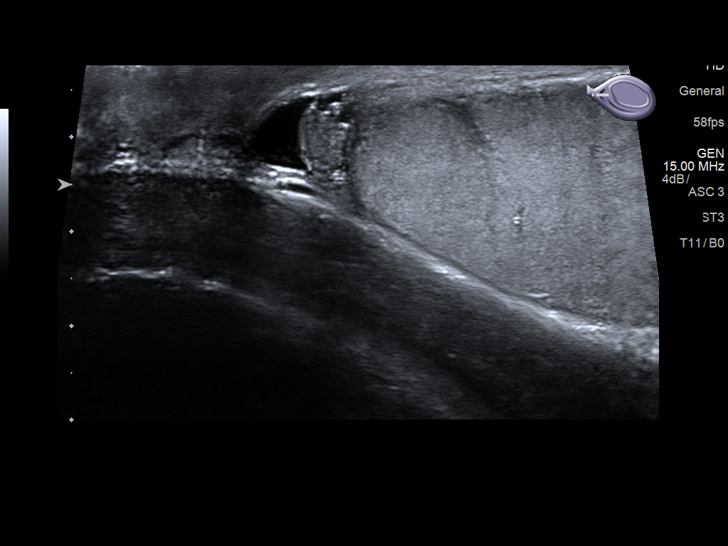
[im 54/54]
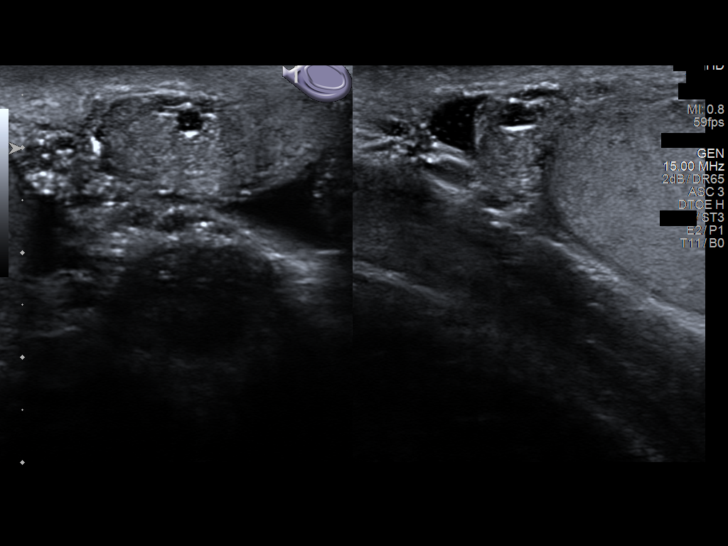

[14 of 25 positions shown; findings below may reference images not displayed]

FINDINGS: Right testicle

Measurements: 5.3 x 2.5 x 2.9 cm. No mass or microlithiasis
visualized.

Left testicle

Measurements: 5.4 x 2.5 x 3.5 cm. No mass or microlithiasis
visualized.

Right epididymis:  Normal in size and appearance.

Left epididymis:  Small cyst measuring 3.1 x 2.7 x 2.5 mm

Hydrocele:  Tiny bilateral hydroceles

Varicocele:  None visualized.

Pulsed Doppler interrogation of both testes demonstrates normal low
resistance arterial and venous waveforms bilaterally.
IMPRESSION: 1. Negative for testicular torsion or mass
2. Small left epididymal cysts
3. Tiny bilateral hydroceles

## 2019-09-22 ENCOUNTER — Other Ambulatory Visit: Payer: Self-pay

## 2019-09-22 ENCOUNTER — Emergency Department
Admission: EM | Admit: 2019-09-22 | Discharge: 2019-09-22 | Disposition: A | Discharge status: Home / Self Care | Payer: Self-pay | Attending: Emergency Medicine | Admitting: Emergency Medicine

## 2019-09-22 ENCOUNTER — Emergency Department: Payer: Self-pay

## 2019-09-22 ENCOUNTER — Encounter: Payer: Self-pay | Admitting: Emergency Medicine

## 2019-09-22 DIAGNOSIS — S60112A Contusion of left thumb with damage to nail, initial encounter: Secondary | ICD-10-CM

## 2019-09-22 DIAGNOSIS — S6010XA Contusion of unspecified finger with damage to nail, initial encounter: Secondary | ICD-10-CM

## 2019-09-22 DIAGNOSIS — Y939 Activity, unspecified: Secondary | ICD-10-CM | POA: Insufficient documentation

## 2019-09-22 DIAGNOSIS — Y929 Unspecified place or not applicable: Secondary | ICD-10-CM | POA: Insufficient documentation

## 2019-09-22 DIAGNOSIS — Y990 Civilian activity done for income or pay: Secondary | ICD-10-CM | POA: Insufficient documentation

## 2019-09-22 DIAGNOSIS — F17210 Nicotine dependence, cigarettes, uncomplicated: Secondary | ICD-10-CM | POA: Insufficient documentation

## 2019-09-22 DIAGNOSIS — W231XXA Caught, crushed, jammed, or pinched between stationary objects, initial encounter: Secondary | ICD-10-CM | POA: Insufficient documentation

## 2019-09-22 MED ORDER — OXYCODONE-ACETAMINOPHEN 5-325 MG PO TABS
1.0000 | ORAL_TABLET | Freq: Once | ORAL | Status: AC
  Administered 2019-09-22: 1 via ORAL
  Filled 2019-09-22: qty 1

## 2019-09-22 MED ORDER — IBUPROFEN 800 MG PO TABS
800.0000 mg | ORAL_TABLET | Freq: Once | ORAL | Status: AC
  Administered 2019-09-22: 800 mg via ORAL
  Filled 2019-09-22: qty 1

## 2019-09-22 NOTE — ED Triage Notes (Signed)
Pt arrives ambulatory to triage with c/o left thumb injury at work around 2130. Pt denies need for WC at this time. Pt states that some metal rods fell on his thumb. Pt is in NAD.

## 2019-09-22 NOTE — ED Notes (Signed)
Reviewed discharge instructions, follow-up care, splint use, and OTC pain medications with patient. Patient verbalized understanding of all information reviewed. Patient stable, with no distress noted at this time.

## 2019-09-22 NOTE — ED Provider Notes (Signed)
[facility] Emergency Department Provider Note   ____________________________________________   First MD Initiated Contact with Patient 09/22/19 [phone]     (approximate)  I have reviewed the triage vital signs and the nursing notes.   HISTORY  Chief Complaint Finger Injury    HPI Adrian Benson is a 34 y.o. male who presents to the ED from work status post left thumb injury.  Patient reports around 9:30 PM some metal rods fell onto his left thumb.  Presents with pain to his left, nondominant thumb.  Voices no other complaints of pain or injuries.       Past Medical History:  Diagnosis Date  . Hepatitis C     There are no problems to display for this patient.   History reviewed. No pertinent surgical history.  Prior to Admission medications   Medication Sig Start Date End Date Taking? Authorizing Provider  doxycycline (VIBRAMYCIN) 100 MG capsule Take 1 capsule (100 mg total) by mouth 2 (two) times daily. 01/09/19   Faythe Ghee, PA-C    Allergies Patient has no known allergies.  No family history on file.  Social History Social History   Tobacco Use  . Smoking status: Current Every Day Smoker    Packs/day: 1.00    Types: Cigarettes  . Smokeless tobacco: Never Used  Substance Use Topics  . Alcohol use: Yes    Comment: occasional  . Drug use: Yes    Types: Marijuana    Review of Systems  Constitutional: No fever/chills Eyes: No visual changes. ENT: No sore throat. Cardiovascular: Denies chest pain. Respiratory: Denies shortness of breath. Gastrointestinal: No abdominal pain.  No nausea, no vomiting.  No diarrhea.  No constipation. Genitourinary: Negative for dysuria. Musculoskeletal: Positive for left thumb pain.  Negative for back pain. Skin: Negative for rash. Neurological: Negative for headaches, focal weakness or numbness.   ____________________________________________   PHYSICAL EXAM:  VITAL SIGNS: ED Triage  Vitals  Enc Vitals Group     BP 09/22/19 0205 (!) 147/108     Pulse Rate 09/22/19 0205 68     Resp 09/22/19 0205 17     Temp 09/22/19 0205 98.6 F (37 C)     Temp Source 09/22/19 0205 Oral     SpO2 09/22/19 0205 98 %     Weight 09/22/19 0203 200 lb (90.7 kg)     Height 09/22/19 0203 6\' 2"  (1.88 m)     Head Circumference --      Peak Flow --      Pain Score 09/22/19 0202 8     Pain Loc --      Pain Edu? --      Excl. in GC? --     Constitutional: Alert and oriented. Well appearing and in mild acute distress. Eyes: Conjunctivae are normal. PERRL. EOMI. Head: Atraumatic. Nose: No congestion/rhinnorhea. Mouth/Throat: Mucous membranes are moist.  Oropharynx non-erythematous. Neck: No stridor.   Cardiovascular: Normal rate, regular rhythm. Grossly normal heart sounds.  Good peripheral circulation. Respiratory: Normal respiratory effort.  No retractions. Lungs CTAB. Gastrointestinal: Soft and nontender. No distention. No abdominal bruits. No CVA tenderness. Musculoskeletal:  Left: Tiny subungual hematoma and redness to dorsal distal thumb.  Full range of motion with some pain.  2+ radial pulses.  Brisk, less than 5-second capillary refill. Neurologic:  Normal speech and language. No gross focal neurologic deficits are appreciated. No gait instability. Skin:  Skin is warm, dry and intact. No rash noted. Psychiatric: Mood and  affect are normal. Speech and behavior are normal.  ____________________________________________   LABS (all labs ordered are listed, but only abnormal results are displayed)  Labs Reviewed - No data to display ____________________________________________  EKG  None ____________________________________________  RADIOLOGY  ED MD interpretation: No acute osseous fracture or dislocation  Official radiology report(s): DG Finger Thumb Left  Result Date: 09/22/2019 CLINICAL DATA:  Struck with metal rod at work EXAM: LEFT THUMB 2+V COMPARISON:  None.  FINDINGS: There is no evidence of fracture or dislocation. There is no evidence of arthropathy or other focal bone abnormality. Mild surrounding soft tissue swelling. IMPRESSION: No acute osseous abnormality Electronically Signed   By: Prudencio Pair M.D.   On: 09/22/2019 02:26    ____________________________________________   PROCEDURES  Procedure(s) performed (including Critical Care):  Procedures   ____________________________________________   INITIAL IMPRESSION / ASSESSMENT AND PLAN / ED COURSE  As part of my medical decision making, I reviewed the following data within the [place] notes reviewed and incorporated, Radiograph reviewed, Notes from prior ED visits and [place] Controlled Substance Database     Adrian Benson was evaluated in Emergency Department on 09/22/2019 for the symptoms described in the history of present illness. He was evaluated in the context of the global COVID-19 pandemic, which necessitated consideration that the patient might be at risk for infection with the SARS-CoV-2 virus that causes COVID-19. Institutional protocols and algorithms that pertain to the evaluation of patients at risk for COVID-19 are in a state of rapid change based on information released by regulatory bodies including the CDC and federal and state organizations. These policies and algorithms were followed during the patient's care in the ED.    34 year old male who presents status post left thumb injury.  Will administer analgesia, x-ray finger and reassess.   Clinical Course as of Sep 21 326  Sat Sep 22, 2019  0239 Updated patient on negative x-ray result.  Will place finger in splint and patient will follow-up with orthopedics as needed.  Strict return precautions given.  Patient verbalizes understanding agrees with plan of care.   [JS]    Clinical Course User Index [JS] Paulette Blanch, MD     ____________________________________________   FINAL  CLINICAL IMPRESSION(S) / ED DIAGNOSES  Final diagnoses:  Contusion of left thumb with damage to nail, initial encounter  Subungual hematoma of finger of left hand, initial encounter     ED Discharge Orders    None       Note:  This document was prepared using Dragon voice recognition software and may include unintentional dictation errors.   Paulette Blanch, MD 09/22/19 [phone]

## 2019-09-22 NOTE — Discharge Instructions (Signed)
1.  You may take Tylenol and/or Ibuprofen as needed for pain. 2.  Wear splint as needed for comfort. 3.  Return to the ER for worsening symptoms or other concerns.

## 2022-06-06 ENCOUNTER — Other Ambulatory Visit: Payer: Self-pay

## 2022-06-06 ENCOUNTER — Emergency Department
Admission: EM | Admit: 2022-06-06 | Discharge: 2022-06-06 | Disposition: A | Discharge status: Home / Self Care | Payer: Self-pay | Attending: Emergency Medicine | Admitting: Emergency Medicine

## 2022-06-06 DIAGNOSIS — K0889 Other specified disorders of teeth and supporting structures: Secondary | ICD-10-CM | POA: Insufficient documentation

## 2022-06-06 MED ORDER — NAPROXEN 500 MG PO TABS
500.0000 mg | ORAL_TABLET | Freq: Once | ORAL | Status: AC
  Administered 2022-06-06: 500 mg via ORAL
  Filled 2022-06-06: qty 1

## 2022-06-06 MED ORDER — NAPROXEN 500 MG PO TABS: 500.0000 mg | ORAL_TABLET | Freq: Two times a day (BID) | ORAL | 2 refills | Status: DC

## 2022-06-06 MED ORDER — PENICILLIN V POTASSIUM 250 MG PO TABS: 250.0000 mg | ORAL_TABLET | Freq: Four times a day (QID) | ORAL | 0 refills | Status: DC

## 2022-06-06 MED ORDER — PENICILLIN V POTASSIUM 250 MG PO TABS
250.0000 mg | ORAL_TABLET | Freq: Once | ORAL | Status: AC
  Administered 2022-06-06: 250 mg via ORAL
  Filled 2022-06-06: qty 1

## 2022-06-06 NOTE — ED Provider Notes (Signed)
   [facility] Provider Note    Event Date/Time   First MD Initiated Contact with Patient 06/06/22 2243     (approximate)   History   Dental Pain   HPI  Adrian Benson is a 36 y.o. male who presents with complaints of left lower dental pain for 2 days.  He recently had extraction of a molar on the right side but that is improved.  No difficulty swallowing, no intraoral swelling     Physical Exam   Triage Vital Signs: ED Triage Vitals  Enc Vitals Group     BP 06/06/22 2236 135/85     Pulse Rate 06/06/22 2236 63     Resp 06/06/22 2236 18     Temp 06/06/22 2236 97.9 F (36.6 C)     Temp Source 06/06/22 2236 Oral     SpO2 06/06/22 2236 100 %     Weight 06/06/22 2238 77.1 kg (170 lb)     Height 06/06/22 2238 1.88 m (6\' 2" )     Head Circumference --      Peak Flow --      Pain Score 06/06/22 2237 9     Pain Loc --      Pain Edu? --      Excl. in GC? --     Most recent vital signs: Vitals:   06/06/22 2236  BP: 135/85  Pulse: 63  Resp: 18  Temp: 97.9 F (36.6 C)  SpO2: 100%     General: Awake, no distress.  CV:  Good peripheral perfusion.  Resp:  Normal effort.  Abd:  No distention.  Other:  Pharynx: Normal uvula, no intraoral swelling.  Mild erythema surrounding lower left second molar, no abscess   ED Results / Procedures / Treatments   Labs (all labs ordered are listed, but only abnormal results are displayed) Labs Reviewed - No data to display   EKG     RADIOLOGY     PROCEDURES:  Critical Care performed:   Procedures   MEDICATIONS ORDERED IN ED: Medications  penicillin v potassium (VEETID) tablet 250 mg (has no administration in time range)  naproxen (NAPROSYN) tablet 500 mg (has no administration in time range)     IMPRESSION / MDM / ASSESSMENT AND PLAN / ED COURSE  I reviewed the triage vital signs and the nursing notes. Patient's presentation is most consistent with acute, uncomplicated  illness.  Exam is most consistent with dental infection, will start penicillin, naproxen for pain, outpatient follow-up with dentist, prescription provided.        FINAL CLINICAL IMPRESSION(S) / ED DIAGNOSES   Final diagnoses:  Pain, dental     Rx / DC Orders   ED Discharge Orders          Ordered    penicillin v potassium (VEETID) 250 MG tablet  4 times daily        06/06/22 2248    naproxen (NAPROSYN) 500 MG tablet  2 times daily with meals        06/06/22 2248             Note:  This document was prepared using Dragon voice recognition software and may include unintentional dictation errors.   2249, MD 06/06/22 2250

## 2022-06-06 NOTE — ED Triage Notes (Signed)
Patient states he had a tooth extraction on the right side. Patient states they did not given him any antibiotics. Patient states his pain has moved from the right side of his mouth to the left. Patient states he is also dizzy.

## 2022-06-06 NOTE — ED Notes (Signed)
Patient being evaluated by Dr.

## 2022-07-27 ENCOUNTER — Emergency Department
Admission: EM | Admit: 2022-07-27 | Discharge: 2022-07-27 | Disposition: A | Discharge status: Home / Self Care | Payer: Self-pay | Attending: Emergency Medicine | Admitting: Emergency Medicine

## 2022-07-27 ENCOUNTER — Encounter: Payer: Self-pay | Admitting: Emergency Medicine

## 2022-07-27 ENCOUNTER — Other Ambulatory Visit: Payer: Self-pay

## 2022-07-27 DIAGNOSIS — K0889 Other specified disorders of teeth and supporting structures: Secondary | ICD-10-CM | POA: Insufficient documentation

## 2022-07-27 MED ORDER — BUPIVACAINE HCL 0.5 % IJ SOLN
50.0000 mL | Freq: Once | INTRAMUSCULAR | Status: AC
  Administered 2022-07-27: 50 mL
  Filled 2022-07-27: qty 50

## 2022-07-27 MED ORDER — NAPROXEN 500 MG PO TABS: 500.0000 mg | ORAL_TABLET | Freq: Two times a day (BID) | ORAL | 0 refills | Status: DC | PRN

## 2022-07-27 MED ORDER — PENICILLIN V POTASSIUM 500 MG PO TABS: 500.0000 mg | ORAL_TABLET | Freq: Four times a day (QID) | ORAL | 0 refills | Status: AC

## 2022-07-27 MED ORDER — PENICILLIN V POTASSIUM 250 MG PO TABS
500.0000 mg | ORAL_TABLET | Freq: Once | ORAL | Status: AC
  Administered 2022-07-27: 500 mg via ORAL
  Filled 2022-07-27: qty 2

## 2022-07-27 NOTE — ED Provider Notes (Signed)
   [facility] Provider Note    Event Date/Time   First MD Initiated Contact with Patient 07/27/22 [phone]     (approximate)   History   Dental Pain   HPI  Adrian Benson is a 37 y.o. male  who presents to the emergency department today because of concerns for dental pain and swelling.  Symptoms started yesterday.  Located in his left lower jaw.  Patient has a history of poor dentition and has had multiple teeth removed.  Has been seen in the emergency department before for dental pain.  He denies any trauma to his jaw.  Has had chills but no fevers.     Physical Exam   Triage Vital Signs: ED Triage Vitals  Enc Vitals Group     BP 07/27/22 0840 (!) 137/98     Pulse Rate 07/27/22 0840 78     Resp 07/27/22 0840 17     Temp 07/27/22 0840 97.7 F (36.5 C)     Temp Source 07/27/22 0840 Oral     SpO2 07/27/22 0840 98 %     Weight 07/27/22 0837 185 lb (83.9 kg)     Height 07/27/22 0837 6\' 2"  (1.88 m)     Head Circumference --      Peak Flow --      Pain Score 07/27/22 0836 8     Pain Loc --      Pain Edu? --      Excl. in [place]? --     Most recent vital signs: Vitals:   07/27/22 0840  BP: (!) 137/98  Pulse: 78  Resp: 17  Temp: 97.7 F (36.5 C)  SpO2: 98%   General: Awake, alert, oriented. CV:  Good peripheral perfusion. Resp:  Normal effort.  Abd:  No distention.  Other:  Slight swelling to left lower jaw. Poor dentition. Tender to palpation of the left lower molars.    ED Results / Procedures / Treatments   Labs (all labs ordered are listed, but only abnormal results are displayed) Labs Reviewed - No data to display   EKG  None   RADIOLOGY None   PROCEDURES:  Critical Care performed: No  Procedures   MEDICATIONS ORDERED IN ED: Medications - No data to display   IMPRESSION / MDM / [place] / ED COURSE  I reviewed the triage vital signs and the nursing notes.                              Differential  diagnosis includes, but is not limited to, dental infection, cavity, salivary gland stone  Patient's presentation is most consistent with acute presentation with potential threat to life or bodily function.  Patient presents to the emergency department today because of concern for left lower jaw pain and swelling. At this time exam is consistent with dental infection. Have low concern for ludwigs. Did inject bupivacaine to help with the discomfort. Will give first dose of antibiotics here.    FINAL CLINICAL IMPRESSION(S) / ED DIAGNOSES   Final diagnoses:  Pain, dental     Note:  This document was prepared using Dragon voice recognition software and may include unintentional dictation errors.    Nance Pear, MD 07/27/22 [phone]

## 2022-07-27 NOTE — ED Triage Notes (Signed)
Pt reports that he began having left lower dental pain and swelling since Sunday night.

## 2022-07-27 NOTE — Discharge Instructions (Signed)
OPTIONS FOR DENTAL FOLLOW UP CARE ° °[place] [facility] [employer] and [employer] - Local Safety Net Dental Clinics °http://www.ncdhhs.gov/dph/oralhealth/services/safetynetclinics.htm °  °[facility] (336-562-3123) ° °[facility] [place] (919-933-9087) ° °[facility] (919-663-1744 ext [phone]) ° °[facility] ([phone]) ° °[facility] ([phone]) °This clinic caters to the indigent population and is on a lottery system. °Location: °[facility], [address], [place] °Clinic Hours: °Wednesdays from 6pm - 9pm, patients seen by a lottery system. °For dates, call or go to www.med.[facility].edu/[facility]/patients/Dental-[facility] °Services: °Cleanings, fillings and simple extractions. °Payment Options: °DENTAL WORK IS FREE OF CHARGE. Bring proof of income or support. °Best way to get seen: °Arrive at 5:15 pm - this is a lottery, NOT first come/first serve, so arriving earlier will not increase your chances of being seen. °  °  °[facility] °919-537-3737 °Select option 1 for emergencies °  °Location: °[facility], [address], [place] °Clinic Hours: °No walk-ins accepted - call the day before to schedule an appointment. °Check in times are 9:30 am and 1:30 pm. °Services: °Simple extractions, temporary fillings, pulpectomy/pulp debridement, uncomplicated abscess drainage. °Payment Options: °PAYMENT IS DUE AT THE TIME OF SERVICE.  Fee is usually $100-200, additional surgical procedures (e.g. abscess drainage) may be extra. °Cash, checks, Visa/MasterCard accepted.  Can file Medicaid if patient is covered for dental - patient should call case worker to check. °No discount for [facility] patients. °Best way to get seen: °MUST call the day before and get onto the schedule. Can usually be seen the next 1-2 days. No walk-ins accepted. °  °  °[facility] °[phone] °   °Location: °[facility], [address], [place] °Clinic Hours: °M, W, Th, F 8am or 1:30pm, Tues 9a or 1:30 - first come/first served. °Services: °Simple extractions, temporary fillings, uncomplicated abscess drainage.  You do not need to be an [place] resident. °Payment Options: °PAYMENT IS DUE AT THE TIME OF SERVICE. °Dental insurance, otherwise sliding scale - bring proof of income or support. °Depending on income and treatment needed, cost is usually $50-200. °Best way to get seen: °Arrive early as it is first come/first served. °  °  °[facility] °[phone] °  °Location: °[address] °Clinic Hours: °Mon-Thu 8a-5p °Services: °Most basic dental services including extractions and fillings. °Payment Options: °PAYMENT IS DUE AT THE TIME OF SERVICE. °Sliding scale, up to 50% off - bring proof if income or support. °Medicaid with dental option accepted. °Best way to get seen: °Call to schedule an appointment, can usually be seen within 2 weeks OR they will try to see walk-ins - show up at 8a or 2p (you may have to wait). °  °  °[facility] °919-245-2435 °[place] RESIDENTS ONLY °  °Location: °[facility], [address], [place], [place] [address] °Clinic Hours: By appointment only. °Monday - Thursday 8am-5pm, Friday 8am-12pm °Services: Cleanings, fillings, extractions. °Payment Options: °PAYMENT IS DUE AT THE TIME OF SERVICE. °Cash, Visa or MasterCard. Sliding scale - $30 minimum per service. °Best way to get seen: °Come in to office, complete packet and make an appointment - need proof of income °or support monies for each household member and proof of [place] residence. °Usually takes about a month to get in. °  °  °[facility] °[phone] °  °Location: °[address]   [place] °Clinic Hours: Walk-in Urgent Care Dental Services are offered Monday-Friday  mornings only. °The numbers of emergencies accepted daily is limited to the number of °providers available. °Maximum 15 - Mondays, Wednesdays & Thursdays °Maximum 10 - Tuesdays & Fridays °Services: °You do not need to be a [place] resident to be seen for a dental emergency. °Emergencies are defined as pain, swelling, abnormal bleeding, or dental trauma. Walkins will receive x-rays if needed. °NOTE: Dental cleaning is not an emergency. °Payment Options: °PAYMENT IS DUE AT THE TIME OF SERVICE. °Minimum co-pay is $40.00 for uninsured patients. °Minimum co-pay is $3.00 for Medicaid with dental coverage. °Dental Insurance is accepted and must be presented at time of visit. °Medicare does not cover dental. °Forms of payment: Cash, credit card, checks. °Best way to get seen: °If not previously registered with the clinic, walk-in dental registration begins at 7:15 am and is on a first come/first serve basis. °If previously registered with the clinic, call to make an appointment. °  °  °[facility] °919-776-4359 °[place] RESIDENTS ONLY °  °Location: °[address], [place], [place] °Clinic Hours: °Mon-Thu 10a-2p °Services: Extractions only! °Payment Options: °FREE (donations accepted) - bring proof of income or support °Best way to get seen: °Call and schedule an appointment OR come at 8am on the 1st Monday of every month (except for holidays) when it is first come/first served. °  °  °[facility] °[phone] °  °Location: °[address], [place] °Clinic Hours: °Friday mornings °Services, Payment Options, Best way to get seen: °Call for info °

## 2022-12-08 ENCOUNTER — Encounter: Payer: Self-pay | Admitting: Emergency Medicine

## 2022-12-08 ENCOUNTER — Emergency Department
Admission: EM | Admit: 2022-12-08 | Discharge: 2022-12-08 | Disposition: A | Discharge status: Home / Self Care | Payer: Self-pay | Attending: Emergency Medicine | Admitting: Emergency Medicine

## 2022-12-08 ENCOUNTER — Emergency Department: Payer: Self-pay

## 2022-12-08 ENCOUNTER — Other Ambulatory Visit: Payer: Self-pay

## 2022-12-08 DIAGNOSIS — M25511 Pain in right shoulder: Secondary | ICD-10-CM

## 2022-12-08 MED ORDER — ETODOLAC 400 MG PO TABS: 400.0000 mg | ORAL_TABLET | Freq: Two times a day (BID) | ORAL | 0 refills | Status: AC

## 2022-12-08 MED ORDER — KETOROLAC TROMETHAMINE 30 MG/ML IJ SOLN
30.0000 mg | Freq: Once | INTRAMUSCULAR | Status: AC
  Administered 2022-12-08: 30 mg via INTRAMUSCULAR
  Filled 2022-12-08: qty 1

## 2022-12-08 NOTE — Discharge Instructions (Signed)
Follow-up with Dr. Audelia Acton who is on-call for orthopedics if your shoulder is not improving.  His office is located in [facility] and phone numbers listed on your discharge papers.  A prescription for etodolac 400 mg was sent to the pharmacy to take twice a day with food.  You may use ice or heat to your shoulder as needed for discomfort.

## 2022-12-08 NOTE — ED Triage Notes (Signed)
Pt via POV from home. Pt c/o R shoulder pain. Pain is worse with movement. Denies injury. States it has been going on for the past 3 weeks. Denies any injury. Pt is A&OX4 and NAD

## 2022-12-08 NOTE — ED Provider Notes (Signed)
[facility] Provider Note    Event Date/Time   First MD Initiated Contact with Patient 12/08/22 1237     (approximate)   History   Shoulder Pain   HPI  Adrian Benson is a 37 y.o. male   presents to the ED with complaint of right shoulder pain for the last 3 weeks.  Patient denies any recent injury.  Patient states that he occasionally can feel his shoulder popping.  He has taken NSAIDs infrequently with minimal relief.      Physical Exam   Triage Vital Signs: ED Triage Vitals  Enc Vitals Group     BP 12/08/22 1148 (!) 136/96     Pulse Rate 12/08/22 1148 74     Resp 12/08/22 1148 18     Temp 12/08/22 1148 98.4 F (36.9 C)     Temp Source 12/08/22 1148 Oral     SpO2 12/08/22 1148 97 %     Weight 12/08/22 1146 160 lb (72.6 kg)     Height 12/08/22 1146 6\' 2"  (1.88 m)     Head Circumference --      Peak Flow --      Pain Score 12/08/22 1146 9     Pain Loc --      Pain Edu? --      Excl. in [place]? --     Most recent vital signs: Vitals:   12/08/22 1148  BP: (!) 136/96  Pulse: 74  Resp: 18  Temp: 98.4 F (36.9 C)  SpO2: 97%     General: Awake, no distress.   CV:  Good peripheral perfusion.  Radial pulse present. Resp:  Normal effort.  Abd:  No distention.  Other:  Right upper extremity without gross deformity.  Minimal crepitus with range of motion and patient is able to move without restriction.  Skin is intact.   ED Results / Procedures / Treatments   Labs (all labs ordered are listed, but only abnormal results are displayed) Labs Reviewed - No data to display    RADIOLOGY 3 images of the right shoulder were reviewed and interpreted by myself independent of the radiologist is negative for fracture or dislocation.  No degenerative joint disease is appreciated.    PROCEDURES:  Critical Care performed:   Procedures   MEDICATIONS ORDERED IN ED: Medications  ketorolac (TORADOL) 30 MG/ML injection 30 mg (30 mg  Intramuscular Given 12/08/22 1322)     IMPRESSION / MDM / ASSESSMENT AND PLAN / ED COURSE  I reviewed the triage vital signs and the nursing notes.   Differential diagnosis includes, but is not limited to, right shoulder pain, strain, tendinitis, bursitis, osteoarthritis.  37 year old male presents to the ED with complaint of right shoulder pain for 3 weeks without known history of injury.  Patient states that he does a lot of lifting at work.  X-rays were reassuring and patient was made aware.  He is to follow-up with Dr. Audelia Acton who is on-call for orthopedics if he continues to have problems or not improving.       Patient's presentation is most consistent with acute complicated illness / injury requiring diagnostic workup.  FINAL CLINICAL IMPRESSION(S) / ED DIAGNOSES   Final diagnoses:  Acute pain of right shoulder     Rx / DC Orders   ED Discharge Orders          Ordered    etodolac (LODINE) 400 MG tablet  2 times daily  12/08/22 1328             Note:  This document was prepared using Dragon voice recognition software and may include unintentional dictation errors.   Tommi Rumps, PA-C 12/08/22 1455    Trinna Post, MD 12/08/22 [phone]
